# Patient Record
Sex: Male | Born: 1985 | Race: Black or African American | Marital: Single | Smoking: Former smoker
Health system: Southern US, Community
[De-identification: ages and names within clinical notes are randomized; demographics above are authoritative.]

## PROBLEM LIST (undated history)

## (undated) DIAGNOSIS — B029 Zoster without complications: Secondary | ICD-10-CM

## (undated) DIAGNOSIS — D849 Immunodeficiency, unspecified: Secondary | ICD-10-CM

## (undated) DIAGNOSIS — Z21 Asymptomatic human immunodeficiency virus [HIV] infection status: Secondary | ICD-10-CM

## (undated) DIAGNOSIS — I639 Cerebral infarction, unspecified: Secondary | ICD-10-CM

## (undated) DIAGNOSIS — D571 Sickle-cell disease without crisis: Secondary | ICD-10-CM

## (undated) DIAGNOSIS — B20 Human immunodeficiency virus [HIV] disease: Secondary | ICD-10-CM

## (undated) HISTORY — DX: Zoster without complications: B02.9

## (undated) HISTORY — PX: BRAIN SURGERY: SHX531

## (undated) HISTORY — PX: CHOLECYSTECTOMY: SHX55

## (undated) HISTORY — DX: Cerebral infarction, unspecified: I63.9

---

## 2013-03-08 ENCOUNTER — Inpatient Hospital Stay (HOSPITAL_COMMUNITY)
Admission: EM | Admit: 2013-03-08 | Discharge: 2013-03-14 | DRG: 812 | Disposition: A | Discharge status: Home / Self Care | Payer: Medicaid Other | Attending: Internal Medicine | Admitting: Internal Medicine

## 2013-03-08 ENCOUNTER — Inpatient Hospital Stay (HOSPITAL_COMMUNITY): Payer: Medicaid Other

## 2013-03-08 ENCOUNTER — Encounter (HOSPITAL_COMMUNITY): Payer: Self-pay | Admitting: *Deleted

## 2013-03-08 ENCOUNTER — Emergency Department (HOSPITAL_COMMUNITY): Payer: Medicaid Other

## 2013-03-08 ENCOUNTER — Ambulatory Visit: Payer: Self-pay

## 2013-03-08 DIAGNOSIS — Z8679 Personal history of other diseases of the circulatory system: Secondary | ICD-10-CM | POA: Insufficient documentation

## 2013-03-08 DIAGNOSIS — D72829 Elevated white blood cell count, unspecified: Secondary | ICD-10-CM | POA: Diagnosis present

## 2013-03-08 DIAGNOSIS — Z21 Asymptomatic human immunodeficiency virus [HIV] infection status: Secondary | ICD-10-CM | POA: Diagnosis present

## 2013-03-08 DIAGNOSIS — D649 Anemia, unspecified: Secondary | ICD-10-CM

## 2013-03-08 DIAGNOSIS — E871 Hypo-osmolality and hyponatremia: Secondary | ICD-10-CM | POA: Diagnosis present

## 2013-03-08 DIAGNOSIS — D5700 Hb-SS disease with crisis, unspecified: Principal | ICD-10-CM | POA: Diagnosis present

## 2013-03-08 DIAGNOSIS — B20 Human immunodeficiency virus [HIV] disease: Secondary | ICD-10-CM

## 2013-03-08 DIAGNOSIS — G9389 Other specified disorders of brain: Secondary | ICD-10-CM | POA: Diagnosis present

## 2013-03-08 DIAGNOSIS — F17200 Nicotine dependence, unspecified, uncomplicated: Secondary | ICD-10-CM | POA: Diagnosis present

## 2013-03-08 HISTORY — DX: Asymptomatic human immunodeficiency virus (hiv) infection status: Z21

## 2013-03-08 HISTORY — DX: Human immunodeficiency virus (HIV) disease: B20

## 2013-03-08 HISTORY — DX: Sickle-cell disease without crisis: D57.1

## 2013-03-08 HISTORY — DX: Immunodeficiency, unspecified: D84.9

## 2013-03-08 LAB — CBC WITH DIFFERENTIAL/PLATELET
Basophils Absolute: 0.1 10*3/uL (ref 0.0–0.1)
Eosinophils Absolute: 0.2 10*3/uL (ref 0.0–0.7)
Hemoglobin: 7.3 g/dL — ABNORMAL LOW (ref 13.0–17.0)
Lymphocytes Relative: 43 % (ref 12–46)
MCH: 30.9 pg (ref 26.0–34.0)
MCHC: 35.4 g/dL (ref 30.0–36.0)
Monocytes Absolute: 0.9 10*3/uL (ref 0.1–1.0)
Neutrophils Relative %: 44 % (ref 43–77)
Platelets: 380 10*3/uL (ref 150–400)
RDW: 23.4 % — ABNORMAL HIGH (ref 11.5–15.5)

## 2013-03-08 LAB — COMPREHENSIVE METABOLIC PANEL
ALT: 34 U/L (ref 0–53)
Albumin: 3.8 g/dL (ref 3.5–5.2)
Alkaline Phosphatase: 77 U/L (ref 39–117)
Calcium: 8.7 mg/dL (ref 8.4–10.5)
Potassium: 3.7 mEq/L (ref 3.5–5.1)
Sodium: 136 mEq/L (ref 135–145)
Total Protein: 10 g/dL — ABNORMAL HIGH (ref 6.0–8.3)

## 2013-03-08 LAB — RETICULOCYTES: Retic Count, Absolute: 630.1 10*3/uL — ABNORMAL HIGH (ref 19.0–186.0)

## 2013-03-08 MED ORDER — PROMETHAZINE HCL 25 MG/ML IJ SOLN
12.5000 mg | Freq: Once | INTRAMUSCULAR | Status: AC
  Administered 2013-03-08: 12.5 mg via INTRAVENOUS
  Filled 2013-03-08: qty 1

## 2013-03-08 MED ORDER — HYDROMORPHONE HCL PF 2 MG/ML IJ SOLN
2.0000 mg | INTRAMUSCULAR | Status: AC | PRN
  Administered 2013-03-08 (×3): 2 mg via INTRAVENOUS
  Filled 2013-03-08 (×3): qty 1

## 2013-03-08 MED ORDER — HYDROMORPHONE HCL PF 2 MG/ML IJ SOLN: 2.0000 mg | Freq: Once | INTRAMUSCULAR | Status: DC

## 2013-03-08 MED ORDER — ONDANSETRON HCL 4 MG/2ML IJ SOLN
4.0000 mg | Freq: Once | INTRAMUSCULAR | Status: AC
  Administered 2013-03-08: 4 mg via INTRAVENOUS
  Filled 2013-03-08: qty 2

## 2013-03-08 NOTE — ED Provider Notes (Signed)
History    CSN: [id number] Arrival date & time 03/08/13  1456  First MD Initiated Contact with Patient 03/08/13 1611     Chief Complaint  Patient presents with  . Sickle Cell Pain Crisis   (Consider location/radiation/quality/duration/timing/severity/associated sxs/prior Treatment) HPI Comments: Patient with h/o sickle cell disease, HIV -- presents with acute pain flare starting today although he started 'feeling bad' yesterday. Pain is in middle back bilaterally with radiation into lower back and both legs to his knees. Associated with shortness of breath. Patient states he receives frequent transfusions -- last 2 weeks ago at [facility]. Recently moved to [place] and does not yet have doctor here. H/o PNA. Denies fever or cough currently. No N/V/D. Uses hydrocodone at home on prn basis but it did not help today. The onset of this condition was acute. The course is constant. Aggravating factors: none. Alleviating factors: none.    Patient is a 27 y.o. male presenting with sickle cell pain. The history is provided by the patient.  Sickle Cell Pain Crisis Associated symptoms: shortness of breath   Associated symptoms: no chest pain, no cough, no fever, no headaches, no nausea, no sore throat and no vomiting    Past Medical History  Diagnosis Date  . Sickle cell anemia   . Immune deficiency disorder   . HIV (human immunodeficiency virus infection)    Past Surgical History  Procedure Laterality Date  . Brain surgery    . Cholecystectomy     History reviewed. No pertinent family history. History  Substance Use Topics  . Smoking status: Current Every Day Smoker    Types: Cigarettes  . Smokeless tobacco: Not on file  . Alcohol Use: Yes    Review of Systems  Constitutional: Negative for fever and chills.  HENT: Negative for sore throat and rhinorrhea.   Eyes: Negative for redness.  Respiratory: Positive for shortness of breath. Negative for cough.   Cardiovascular: Negative  for chest pain.  Gastrointestinal: Negative for nausea, vomiting, abdominal pain and diarrhea.  Genitourinary: Negative for dysuria.  Musculoskeletal: Positive for myalgias, back pain and arthralgias.  Skin: Positive for rash (resolving shingles right middle back).  Neurological: Negative for headaches.    Allergies  Banana; Other; and Morphine and related  Home Medications   Current Outpatient Rx  Name  Route  Sig  Dispense  Refill  . aspirin EC 81 MG tablet   Oral   Take 81 mg by mouth daily.         Marland Kitchen FOLIC ACID PO   Oral   Take 25 mcg by mouth every evening.         . promethazine (PHENERGAN) 25 MG tablet   Oral   Take 25 mg by mouth every 6 (six) hours as needed for nausea.          BP 113/62  Pulse 88  Temp(Src) 98.3 F (36.8 C) (Oral)  Resp 16  SpO2 92%  Physical Exam  Nursing note and vitals reviewed. Constitutional: He appears well-developed and well-nourished.  HENT:  Head: Normocephalic and atraumatic.  Eyes: Conjunctivae and EOM are normal. Pupils are equal, round, and reactive to light. Right eye exhibits no discharge. Left eye exhibits no discharge.  Icteric.   Neck: Normal range of motion. Neck supple.  Cardiovascular: Normal rate, regular rhythm and normal heart sounds.   Pulmonary/Chest: Effort normal and breath sounds normal.  Abdominal: Soft. There is no tenderness.  Musculoskeletal: He exhibits no edema and no tenderness.  Neurological: He is alert.  Skin: Skin is warm and dry. Rash noted. There is erythema.  R middle back, resolving vesicles  Psychiatric: He has a normal mood and affect.    ED Course  Procedures (including critical care time) Labs Reviewed  CBC WITH DIFFERENTIAL - Abnormal; Notable for the following:    RBC 2.36 (*)    Hemoglobin 7.3 (*)    HCT 20.6 (*)    RDW 23.4 (*)    All other components within normal limits  COMPREHENSIVE METABOLIC PANEL - Abnormal; Notable for the following:    Total Protein 10.0 (*)     AST 95 (*)    Total Bilirubin 9.1 (*)    All other components within normal limits  RETICULOCYTES - Abnormal; Notable for the following:    Retic Ct Pct 26.7 (*)    RBC. 2.36 (*)    Retic Count, Manual 630.1 (*)    All other components within normal limits  URINALYSIS, ROUTINE W REFLEX MICROSCOPIC   Dg Chest 2 View  03/08/2013   *RADIOLOGY REPORT*  Clinical Data: Back pain.  Sickle cell crisis.  CHEST - 2 VIEW  Comparison: None.  Findings: The heart size and pulmonary vascularity are normal and the lungs are clear.  No osseous abnormality.  IMPRESSION: Normal chest.   Original Report Authenticated By: Francene Boyers, M.D.   1. Sickle cell anemia with crisis     4:58 PM Patient seen and examined. Work-up initiated. Medications ordered.   Vital signs reviewed and are as follows: Filed Vitals:   03/08/13 1527  BP: 113/62  Pulse: 88  Temp: 98.3 F (36.8 C)  Resp: 16   7:42 PM [facility] records show recent hematology and ID appointments over past month. Previous Tbili into low teens at times, most recently 6.6 2 weeks ago. Previous CD4 in 600's. Baseline hgb 7.0-8.5.   No improvement at all with 2 doses of pain medication. Will need admission.   8:12 PM Spoke with [facility] who will see.    MDM  Sickle cell anemia with vaso-occlusive crisis.   Renne Crigler, PA-C 03/08/13 2013

## 2013-03-08 NOTE — ED Provider Notes (Signed)
Medical screening examination/treatment/procedure(s) were conducted as a shared visit with non-physician practitioner(s) and myself.  I personally evaluated the patient during the encounter  Typical sickle cell pain in back and legs. No fever.  No chest pain or SOB. 5/5 strength in bilateral lower extremities. Ankle plantar and dorsiflexion intact. Great toe extension intact bilaterally. +2 DP and PT pulses. +2 patellar reflexes bilaterally. Normal gait.   Glynn Octave, MD 03/08/13 2140

## 2013-03-08 NOTE — H&P (Signed)
[facility] History and Physical  Raheel Kunkle ZOX:[id number] DOB: 1985/10/20 DOA: 03/08/2013  Referring physician: ER physician. PCP: No PCP Per Patient  Specialists: Follows at [facility] for sickle cell disease and HIV.  Chief Complaint: Pain.  HPI: Jeremy Mclaughlin is a 27 y.o. male with known history of SS hemoglobin sickle cell disease, HIV and history of moyamoya syndrome status post craniectomy, presented to the ER because of low back pain and lower extremity pain. Patient has been having these symptoms since today morning. Had mild shortness of breath but chest x-ray was unremarkable and patient was not hypoxic. Patient denies any fever chills chest pain diarrhea. Patient also has been having some headache since morning with some nausea but denies any weakness of the upper or lower extremities. At this time patient has been admitted for pain relief secondary to sickle cell crisis. Patient states that he has had recent blood works done at [facility] 2 weeks ago and his CD4 count was around 650 and he is yet to be started on antiretroviral until his medical insurance has been confirmed. Patient will be transferred to [facility] for further management and patient is in agreement. Patient did have one episode of nausea and vomiting in the ER.  Review of Systems: As presented in the history of presenting illness, rest negative.  Past Medical History  Diagnosis Date  . Sickle cell anemia   . Immune deficiency disorder   . HIV (human immunodeficiency virus infection)    Past Surgical History  Procedure Laterality Date  . Brain surgery    . Cholecystectomy     Social History:  reports that he has been smoking Cigarettes.  He has been smoking about 0.00 packs per day. He does not have any smokeless tobacco history on file. He reports that  drinks alcohol. He reports that he does not use illicit drugs. Home. where does patient live-- Can do ADLs. Can patient  participate in ADLs?  Allergies  Allergen Reactions  . Banana Anaphylaxis  . Other Anaphylaxis and Rash    melons  . Morphine And Related     Doesn't work for pt    History reviewed. No pertinent family history.    Prior to Admission medications   Medication Sig Start Date End Date Taking? Authorizing Provider  aspirin EC 81 MG tablet Take 81 mg by mouth daily.   Yes Historical Provider, MD  FOLIC ACID PO Take 25 mcg by mouth every evening.   Yes Historical Provider, MD  promethazine (PHENERGAN) 25 MG tablet Take 25 mg by mouth every 6 (six) hours as needed for nausea.   Yes Historical Provider, MD   Physical Exam: Filed Vitals:   03/08/13 1527 03/08/13 1630 03/08/13 1700 03/08/13 2020  BP: 113/62 116/54 117/61 117/66  Pulse: 88 70 56 62  Temp: 98.3 F (36.8 C)     TempSrc: Oral     Resp: 16 14 16    SpO2: 92% 100% 100% 96%     General:  Well-developed and nourished.  Eyes: Anicteric no pallor.  ENT: No discharge from the ears eyes nose mouth.  Neck: No mass felt.  Cardiovascular: S1-S2 heard.  Respiratory: No rhonchi or crepitations.  Abdomen: Soft nontender bowel sounds present.  Skin: No rash.  Musculoskeletal: No edema.  Psychiatric: Appears normal.  Neurologic: Alert awake oriented to time place and person. Moves all extremities 5 x 5.  Labs on Admission:  Basic Metabolic Panel:  Recent Labs Lab 03/08/13 1707  NA 136  K 3.7  CL 102  CO2 28  GLUCOSE 87  BUN 6  CREATININE 0.70  CALCIUM 8.7   Liver Function Tests:  Recent Labs Lab 03/08/13 1707  AST 95*  ALT 34  ALKPHOS 77  BILITOT 9.1*  PROT 10.0*  ALBUMIN 3.8   No results found for this basename: LIPASE, AMYLASE,  in the last 168 hours No results found for this basename: AMMONIA,  in the last 168 hours CBC:  Recent Labs Lab 03/08/13 1707  WBC 9.3  NEUTROABS 4.1  HGB 7.3*  HCT 20.6*  MCV 87.3  PLT 380   Cardiac Enzymes: No results found for this basename: CKTOTAL, CKMB,  CKMBINDEX, TROPONINI,  in the last 168 hours  BNP (last 3 results) No results found for this basename: PROBNP,  in the last 8760 hours CBG: No results found for this basename: GLUCAP,  in the last 168 hours  Radiological Exams on Admission: Dg Chest 2 View  03/08/2013   *RADIOLOGY REPORT*  Clinical Data: Back pain.  Sickle cell crisis.  CHEST - 2 VIEW  Comparison: None.  Findings: The heart size and pulmonary vascularity are normal and the lungs are clear.  No osseous abnormality.  IMPRESSION: Normal chest.   Original Report Authenticated By: Francene Boyers, M.D.     Assessment/Plan Principal Problem:   Sickle cell pain crisis Active Problems:   History of Moyamoya syndrome   HIV (human immunodeficiency virus infection)   Anemia   1. Sickle cell pain crisis - patient has been placed on scheduled dose of Dilaudid. Continue home medications. Gentle hydration. 2. HIV - as per patient patient's last CD4 count 2 weeks ago was 650 and patient is to follow with [facility] clinic with regards to his HIV medications. 3. Anemia - secondary sickle cell disease. 4. History of moyamoya syndrome - I did discuss with on-call neurologist about patient's headache. At this time Dr. Amada Jupiter on-call urologist as advised to get a CT head. Otherwise patient is nonfocal.  Patient will be transferred to [facility]. I did discuss with Dr. Houston Siren who will be the accepting physician at [facility]. Patient is in agreement with transfer.  Code Status: Full code.  Family Communication: None.  Disposition Plan: Admit to inpatient.    KAKRAKANDY,ARSHAD N. [facility] Pager [phone].  If 7PM-7AM, please contact night-coverage [email or web address] Password [facility] 03/08/2013, 9:55 PM

## 2013-03-08 NOTE — ED Notes (Addendum)
To ED for eval of sickle cell pain that started a few hrs pta. Pain in mid back that shoots down to legs. Last crisis was 2 months ago. Pt is usually seen at [facility]. Pt has blood transfusions every 6 wks and had one 2 wks ago

## 2013-03-08 NOTE — ED Notes (Signed)
Pt placed on 2L [place] O2 while in waiting room

## 2013-03-09 ENCOUNTER — Encounter (HOSPITAL_COMMUNITY): Payer: Self-pay | Admitting: *Deleted

## 2013-03-09 DIAGNOSIS — D72829 Elevated white blood cell count, unspecified: Secondary | ICD-10-CM | POA: Diagnosis present

## 2013-03-09 DIAGNOSIS — E871 Hypo-osmolality and hyponatremia: Secondary | ICD-10-CM | POA: Diagnosis present

## 2013-03-09 LAB — BASIC METABOLIC PANEL
CO2: 27 mEq/L (ref 19–32)
Calcium: 8.8 mg/dL (ref 8.4–10.5)
Chloride: 104 mEq/L (ref 96–112)
Creatinine, Ser: 0.6 mg/dL (ref 0.50–1.35)
GFR calc Af Amer: >90 mL/min (ref >90)
Sodium: 134 mEq/L — ABNORMAL LOW (ref 135–145)

## 2013-03-09 LAB — CBC
MCV: 84.8 fL (ref 78.0–100.0)
Platelets: 315 10*3/uL (ref 150–400)
RBC: 2.11 MIL/uL — ABNORMAL LOW (ref 4.22–5.81)
RDW: 21.6 % — ABNORMAL HIGH (ref 11.5–15.5)
WBC: 11.8 10*3/uL — ABNORMAL HIGH (ref 4.0–10.5)

## 2013-03-09 LAB — URINALYSIS, ROUTINE W REFLEX MICROSCOPIC
Glucose, UA: NEGATIVE mg/dL
Ketones, ur: NEGATIVE mg/dL
Leukocytes, UA: NEGATIVE
Nitrite: NEGATIVE
Protein, ur: NEGATIVE mg/dL
pH: 5.5 (ref 5.0–8.0)

## 2013-03-09 LAB — PREPARE RBC (CROSSMATCH)

## 2013-03-09 LAB — URINE MICROSCOPIC-ADD ON

## 2013-03-09 MED ORDER — ONDANSETRON HCL 4 MG PO TABS
4.0000 mg | ORAL_TABLET | Freq: Four times a day (QID) | ORAL | Status: DC | PRN
  Administered 2013-03-10 (×2): 4 mg via ORAL
  Filled 2013-03-09 (×2): qty 1

## 2013-03-09 MED ORDER — ACETAMINOPHEN 325 MG PO TABS
650.0000 mg | ORAL_TABLET | Freq: Four times a day (QID) | ORAL | Status: DC | PRN
  Administered 2013-03-09: 650 mg via ORAL
  Filled 2013-03-09: qty 2

## 2013-03-09 MED ORDER — PROMETHAZINE HCL 25 MG/ML IJ SOLN
12.5000 mg | Freq: Four times a day (QID) | INTRAMUSCULAR | Status: DC | PRN
  Administered 2013-03-09 (×2): 12.5 mg via INTRAVENOUS
  Filled 2013-03-09 (×2): qty 1

## 2013-03-09 MED ORDER — SODIUM CHLORIDE 0.45 % IV SOLN
INTRAVENOUS | Status: DC
  Administered 2013-03-09: 100 mL/h via INTRAVENOUS
  Administered 2013-03-09: 19:00:00 via INTRAVENOUS

## 2013-03-09 MED ORDER — HYDROMORPHONE HCL PF 2 MG/ML IJ SOLN
2.0000 mg | INTRAMUSCULAR | Status: DC
  Administered 2013-03-09 – 2013-03-10 (×11): 2 mg via INTRAVENOUS
  Filled 2013-03-09 (×11): qty 1

## 2013-03-09 MED ORDER — ACETAMINOPHEN 650 MG RE SUPP: 650.0000 mg | Freq: Four times a day (QID) | RECTAL | Status: DC | PRN

## 2013-03-09 MED ORDER — ONDANSETRON HCL 4 MG/2ML IJ SOLN
4.0000 mg | Freq: Four times a day (QID) | INTRAMUSCULAR | Status: DC | PRN
  Administered 2013-03-09 – 2013-03-10 (×4): 4 mg via INTRAVENOUS
  Filled 2013-03-09 (×4): qty 2

## 2013-03-09 MED ORDER — VITAMINS A & D EX OINT
TOPICAL_OINTMENT | CUTANEOUS | Status: AC
  Administered 2013-03-09: 17:00:00
  Filled 2013-03-09: qty 5

## 2013-03-09 MED ORDER — ASPIRIN EC 81 MG PO TBEC
81.0000 mg | DELAYED_RELEASE_TABLET | Freq: Every day | ORAL | Status: DC
  Administered 2013-03-09 – 2013-03-14 (×6): 81 mg via ORAL
  Filled 2013-03-09 (×6): qty 1

## 2013-03-09 NOTE — Care Management (Signed)
Patient: Jeremy Mclaughlin DOB :December 14, 1985 MRN :[id number]  Date: 03/09/2013  Documentation Initiated by : Jefm Miles  Subjective/Objective Assessment: Mr. Jeremy Mclaughlin is a 27 year old male currently inpatient. Mr. Jeremy Mclaughlin stated "he does not have a PCP." Mr. Jeremy Mclaughlin stated "he is new to this area", he previously resided in [place].  This CM explained the day hospital availablity with [facility] and he would need to have Dr. [name] as his PCP to take advantage of the [facility] day hospital. Mr. Jeremy Mclaughlin verbalized understanding.   Barriers to Care: Mr. Jeremy Mclaughlin needs a PCP  Prior Approval (PA) #: NA PA start date:NA PA end date:  NA  Action/Plan: This CM provided Mr. Jeremy Mclaughlin with a potential new patient packet to complete.   Comments: Mr. Jeremy Mclaughlin did not have any other questions or concerns at this time.  Time spent: 15 minutes  Karoline Caldwell, RN, BSN, [place]    [phone]

## 2013-03-09 NOTE — Progress Notes (Signed)
[facility] PROGRESS NOTE  Jeremy Mclaughlin AVW:[id number] DOB: 09/06/1986 DOA: 03/08/2013 PCP: No PCP Per Patient  Brief narrative: 27 -year-old male with past medical history of sickle cell disease, SS hemoglobin disease, HIV who presented to [facility] ED 03/08/2013 with worsening low back pain and lower extremity pain. Patient has had the symptoms over past 24 hours prior to this admission. He has experienced associated shortness of breath but no chest pain, no fevers or chills. No abdominal pain. In ED, blood pressure is 113/62, HR 50, T 97.8 F and O2 saturation 89% on room air. CBC revealed hemoglobin of 7.3. CT head was negative for acute intracranial findings but he does have postoperative changes from previous brain surgery (for Moyamoya syndrome). Chest x-ray was within normal limits.  Assessment/Plan:  Principal Problem:   Sickle cell pain crisis - Current pain regimen is with a Dilaudid 2 mg IV every 3 hours scheduled  - Patient reports being in pain even with this regimen but feels comfortable with the current pain level, 6/10 - Advance diet as tolerated Active Problems:   HIV (human immunodeficiency virus infection) - Check CD4 count   Anemia of chronic disease - Secondary to history of sickle cell disease - We will give 1 unit of blood today as hemoglobin is 6.5 today.   Leukocytosis - No fever or signs of acute infection - Likely reactive   Hyponatremia - Likely secondary to dehydration - Continue IV fluids  Code Status: full code Family Communication: no family at the bedside Disposition Plan: home when stable   Manson Passey, MD  [facility] Pager [phone]  If 7PM-7AM, please contact night-coverage [email or web address] Password [facility] 03/09/2013, 9:53 AM   LOS: 1 day   Consultants:  None  Procedures:  None  Antibiotics:  None  HPI/Subjective: No acute overnight events.  Objective: Filed Vitals:   03/08/13 2020 03/08/13 2309 03/09/13 0035 03/09/13 0500  BP: 117/66  127/68 124/64 125/57  Pulse: 62 63 50 72  Temp:   98.1 F (36.7 C) 97.8 F (36.6 C)  TempSrc:   Oral Oral  Resp:   16 14  Height:   5\' 5"  (1.651 m)   Weight:   61.1 kg (134 lb 11.2 oz) 60.782 kg (134 lb)  SpO2: 96% 93% 99% 89%    Intake/Output Summary (Last 24 hours) at 03/09/13 0953 Last data filed at 03/09/13 0248  Gross per 24 hour  Intake      0 ml  Output    500 ml  Net   -500 ml    Exam:   General:  Pt is alert, follows commands appropriately, not in acute distress  Cardiovascular: Regular rate and rhythm, S1/S2, no murmurs, no rubs, no gallops  Respiratory: Clear to auscultation bilaterally, no wheezing, no crackles, no rhonchi  Abdomen: Soft, non tender, non distended, bowel sounds present, no guarding  Extremities: No edema, pulses DP and PT palpable bilaterally  Neuro: Grossly nonfocal  Data Reviewed: Basic Metabolic Panel:  Recent Labs Lab 03/08/13 1707 03/09/13 0343  NA 136 134*  K 3.7 3.5  CL 102 104  CO2 28 27  GLUCOSE 87 95  BUN 6 5*  CREATININE 0.70 0.60  CALCIUM 8.7 8.8   Liver Function Tests:  Recent Labs Lab 03/08/13 1707  AST 95*  ALT 34  ALKPHOS 77  BILITOT 9.1*  PROT 10.0*  ALBUMIN 3.8   No results found for this basename: LIPASE, AMYLASE,  in the last 168 hours No results found for this  basename: AMMONIA,  in the last 168 hours CBC:  Recent Labs Lab 03/08/13 1707 03/09/13 0343  WBC 9.3 11.8*  NEUTROABS 4.1  --   HGB 7.3* 6.5*  HCT 20.6* 17.9*  MCV 87.3 84.8  PLT 380 315   Cardiac Enzymes: No results found for this basename: CKTOTAL, CKMB, CKMBINDEX, TROPONINI,  in the last 168 hours BNP: No components found with this basename: POCBNP,  CBG: No results found for this basename: GLUCAP,  in the last 168 hours  No results found for this or any previous visit (from the past 240 hour(s)).   Studies: Dg Chest 2 View 03/08/2013     IMPRESSION: Normal chest.   Original Report Authenticated By: Francene Boyers, M.D.    Ct Head Wo Contrast 03/08/2013    IMPRESSION: Postoperative changes with right frontal encephalomalacia. No acute intracranial abnormality.   Original Report Authenticated By: Charlett Nose, M.D.    Scheduled Meds: . aspirin EC  81 mg Oral Daily  .  HYDROmorphone (DILAUDID) injection  2 mg Intravenous Q3H   Continuous Infusions: . sodium chloride 100 mL/hr (03/09/13 0302)

## 2013-03-09 NOTE — Progress Notes (Signed)
CALLED  HGB OF  6.5 TO HOUSE COVERAGE MD, NO NEW ORDERS, Geni Skorupski RN

## 2013-03-09 NOTE — Care Management Note (Addendum)
CARE MANAGEMENT NOTE 03/09/2013  Patient:  Jeremy Mclaughlin, Jeremy Mclaughlin   Account Number:  [id number]  Date Initiated:  03/09/2013  Documentation initiated by:  Donette Mainwaring  Subjective/Objective Assessment:   27 yo male admitted with sickle cell crisis.     Action/Plan:   Home when stable   Anticipated DC Date:     Anticipated DC Plan:  HOME/SELF CARE  In-house referral  [facility]      DC Planning Services  CM consult  PCP issues      Choice offered to / List presented to:  NA   DME arranged  NA      DME agency  NA        Status of service:  In process, will continue to follow Medicare Important Message given?   (If response is "NO", the following Medicare IM given date fields will be blank) Date Medicare IM given:   Date Additional Medicare IM given:    Discharge Disposition:    Per UR Regulation:  Reviewed for med. necessity/level of care/duration of stay  If discussed at Long Length of Stay Meetings, dates discussed:    Comments:  03/09/13 1208 Leonie Green [phone] Cm spoke with patient at bedside concerning identified barriers of self-pay admission. Per pt recently applied for medicaid and insurance through [employer] and [employer]. Cm contacted [facility] at [facility] to establish possible Primary Care for pt at [facility] with Dr.Matthews. Will continue to follow for possible medication assistance.

## 2013-03-10 LAB — TYPE AND SCREEN
ABO/RH(D): A POS
Antibody Screen: NEGATIVE
Unit division: 0

## 2013-03-10 LAB — BASIC METABOLIC PANEL
BUN: 4 mg/dL — ABNORMAL LOW (ref 6–23)
Calcium: 8.6 mg/dL (ref 8.4–10.5)
Chloride: 97 mEq/L (ref 96–112)
Creatinine, Ser: 0.51 mg/dL (ref 0.50–1.35)
GFR calc Af Amer: >90 mL/min (ref >90)

## 2013-03-10 LAB — T-HELPER CELLS (CD4) COUNT (NOT AT ARMC)
CD4 % Helper T Cell: 14 % — ABNORMAL LOW (ref 33–55)
CD4 T Cell Abs: 600 uL (ref 400–2700)

## 2013-03-10 LAB — CBC
HCT: 19.1 % — ABNORMAL LOW (ref 39.0–52.0)
MCHC: 36.6 g/dL — ABNORMAL HIGH (ref 30.0–36.0)
MCV: 80.9 fL (ref 78.0–100.0)
RDW: 20 % — ABNORMAL HIGH (ref 11.5–15.5)

## 2013-03-10 MED ORDER — SODIUM CHLORIDE 0.45 % IV SOLN: INTRAVENOUS | Status: DC

## 2013-03-10 MED ORDER — AZITHROMYCIN 600 MG PO TABS
1200.0000 mg | ORAL_TABLET | ORAL | Status: DC
  Filled 2013-03-10: qty 2

## 2013-03-10 MED ORDER — ONDANSETRON HCL 4 MG PO TABS
4.0000 mg | ORAL_TABLET | ORAL | Status: DC | PRN
  Administered 2013-03-10 – 2013-03-14 (×7): 4 mg via ORAL
  Filled 2013-03-10 (×7): qty 1

## 2013-03-10 MED ORDER — SODIUM CHLORIDE 0.9 % IV SOLN
INTRAVENOUS | Status: DC
  Administered 2013-03-10 – 2013-03-11 (×3): via INTRAVENOUS

## 2013-03-10 MED ORDER — SULFAMETHOXAZOLE-TMP DS 800-160 MG PO TABS
1.0000 | ORAL_TABLET | ORAL | Status: DC
  Filled 2013-03-10: qty 1

## 2013-03-10 MED ORDER — HYDROMORPHONE HCL PF 2 MG/ML IJ SOLN
2.0000 mg | INTRAMUSCULAR | Status: DC | PRN
  Administered 2013-03-10 – 2013-03-12 (×7): 2 mg via INTRAVENOUS
  Filled 2013-03-10 (×8): qty 1

## 2013-03-10 MED ORDER — HYDROMORPHONE HCL 2 MG PO TABS
2.0000 mg | ORAL_TABLET | ORAL | Status: DC
  Administered 2013-03-10 – 2013-03-12 (×10): 2 mg via ORAL
  Filled 2013-03-10 (×13): qty 1

## 2013-03-10 MED ORDER — ONDANSETRON HCL 4 MG/2ML IJ SOLN
4.0000 mg | INTRAMUSCULAR | Status: DC | PRN
  Administered 2013-03-11 (×2): 4 mg via INTRAVENOUS
  Filled 2013-03-10 (×2): qty 2

## 2013-03-10 NOTE — Progress Notes (Signed)
Pharmacy Note - Bactrim for PCP Prophylaxis  A/P: To start Bactrim for PCP ppx in HIV patient. Labs reviewed. Start Bactrim DS 1 tab MWF. Will sign off   Hessie Knows, PharmD, BCPS Pager [phone] 03/10/2013 1:55 PM

## 2013-03-10 NOTE — Progress Notes (Addendum)
[facility] PROGRESS NOTE  Jeremy Mclaughlin ZOX:[id number] DOB: Jan 28, 1986 DOA: 03/08/2013 PCP: No PCP Per Patient  Brief narrative: 27 -year-old male with past medical history of sickle cell disease, SS hemoglobin disease, HIV who presented to [facility] ED 03/08/2013 with worsening low back pain and lower extremity pain. Patient has had the symptoms over past 24 hours prior to this admission. He has experienced associated shortness of breath but no chest pain, no fevers or chills. No abdominal pain.  In ED, blood pressure is 113/62, HR 50, T 97.8 F and O2 saturation 89% on room air. CBC revealed hemoglobin of 7.3. CT head was negative for acute intracranial findings but he does have postoperative changes from previous brain surgery (for Moyamoya syndrome). Chest x-ray was within normal limits.   Assessment/Plan:   Principal Problem:  Sickle cell pain crisis  - Current pain regimen is with a Dilaudid 2 mg IV every 3 hours scheduled and we will change this to every 3 hours as needed. Added Dilaudid 2 mg every 3 hours as needed by mouth for moderate pain. - Advance diet as tolerated  Active Problems:  HIV (human immunodeficiency virus infection)  - CD4 count 600 Anemia of chronic disease  - Secondary to history of sickle cell disease  - Status post 1 unit of blood 03/09/2013 for hemoglobin of 6.5. Hemoglobin is 7 today. - Follow up CBC in the morning  Leukocytosis  - We will start antibiotics as above. Hyponatremia  - Likely secondary to dehydration  - D/C IV fluids; diet as tolerated   Code Status: full code  Family Communication: no family at the bedside  Disposition Plan: home when stable   Manson Passey, MD  [facility]  Pager [phone]   Consultants:  ID Procedures:  None Antibiotics:  None   If 7PM-7AM, please contact night-coverage [email or web address] Password [facility] 03/10/2013, 12:14 PM   LOS: 2 days    HPI/Subjective: No acute overnight events.  Objective: Filed Vitals:    03/10/13 0014 03/10/13 0226 03/10/13 0444 03/10/13 1010  BP: 132/72 128/66 132/80 132/82  Pulse: 88 85 99 81  Temp: 98.9 F (37.2 C) 98.7 F (37.1 C) 98.5 F (36.9 C) 98.3 F (36.8 C)  TempSrc: Oral Oral Oral   Resp: 18 18 18 16   Height:      Weight:      SpO2:  93% 92% 95%    Intake/Output Summary (Last 24 hours) at 03/10/13 1214 Last data filed at 03/10/13 1100  Gross per 24 hour  Intake  732.5 ml  Output   2150 ml  Net -1417.5 ml    Exam:   General:  Pt is alert, follows commands appropriately, not in acute distress  Cardiovascular: Regular rate and rhythm, S1/S2, no murmurs, no rubs, no gallops  Respiratory: Clear to auscultation bilaterally, no wheezing, no crackles, no rhonchi  Abdomen: Soft, non tender, non distended, bowel sounds present, no guarding  Extremities: No edema, pulses DP and PT palpable bilaterally  Neuro: Grossly nonfocal  Data Reviewed: Basic Metabolic Panel:  Recent Labs Lab 03/08/13 1707 03/09/13 0343 03/10/13 0250  NA 136 134* 132*  K 3.7 3.5 3.6  CL 102 104 97  CO2 28 27 27   GLUCOSE 87 95 90  BUN 6 5* 4*  CREATININE 0.70 0.60 0.51  CALCIUM 8.7 8.8 8.6   Liver Function Tests:  Recent Labs Lab 03/08/13 1707  AST 95*  ALT 34  ALKPHOS 77  BILITOT 9.1*  PROT 10.0*  ALBUMIN 3.8  No results found for this basename: LIPASE, AMYLASE,  in the last 168 hours No results found for this basename: AMMONIA,  in the last 168 hours CBC:  Recent Labs Lab 03/08/13 1707 03/09/13 0343 03/10/13 0250  WBC 9.3 11.8* 12.0*  NEUTROABS 4.1  --   --   HGB 7.3* 6.5* 7.0*  HCT 20.6* 17.9* 19.1*  MCV 87.3 84.8 80.9  PLT 380 315 280   Cardiac Enzymes: No results found for this basename: CKTOTAL, CKMB, CKMBINDEX, TROPONINI,  in the last 168 hours BNP: No components found with this basename: POCBNP,  CBG: No results found for this basename: GLUCAP,  in the last 168 hours  No results found for this or any previous visit (from the past  240 hour(s)).   Studies: Dg Chest 2 View  03/08/2013   *RADIOLOGY REPORT*  Clinical Data: Back pain.  Sickle cell crisis.  CHEST - 2 VIEW  Comparison: None.  Findings: The heart size and pulmonary vascularity are normal and the lungs are clear.  No osseous abnormality.  IMPRESSION: Normal chest.   Original Report Authenticated By: Francene Boyers, M.D.   Ct Head Wo Contrast  03/08/2013   *RADIOLOGY REPORT*  Clinical Data: Sickle cell pain.  Headache.  CT HEAD WITHOUT CONTRAST  Technique:  Contiguous axial images were obtained from the base of the skull through the vertex without contrast.  Comparison: None.  Findings: Postoperative changes from prior right craniotomy. Underlying right frontal encephalomalacia.  No acute intracranial abnormality.  Specifically, no hemorrhage, hydrocephalus, mass lesion, acute infarction, or significant intracranial injury.  No acute calvarial abnormality. Visualized paranasal sinuses and mastoids clear.  Orbital soft tissues unremarkable.  IMPRESSION: Postoperative changes with right frontal encephalomalacia. No acute intracranial abnormality.   Original Report Authenticated By: Charlett Nose, M.D.    Scheduled Meds: . aspirin EC  81 mg Oral Daily  . HYDROmorphone  2 mg Oral Q3H   Continuous Infusions: . sodium chloride 75 mL/hr at 03/10/13 [phone]

## 2013-03-10 NOTE — Consult Note (Signed)
    [facility]     Reason for Consult: 042    Referring Physician: dr. Elisabeth Pigeon  Principal Problem:   Sickle cell pain crisis Active Problems:   HIV (human immunodeficiency virus infection)   Anemia   Leukocytosis, unspecified   Hyponatremia   . aspirin EC  81 mg Oral Daily  . azithromycin  1,200 mg Oral Weekly  . HYDROmorphone  2 mg Oral Q3H  . sulfamethoxazole-trimethoprim  1 tablet Oral 3 times weekly    Recommendations: Will hold on ARVs until seen in clinic Has contact with our case manager Tad Moore) and is going to get into our clinic for follow up and transfer care to [place] No need for prophylaxis with CD4 of 600  Thanks for the consult  Assessment: HIV and sickle cell with [place] crises.  Last CD 4 of 770 at [facility] this month and viral load about 1500 and CD4 count here is 600 (14%).  Has not previously been on meds but interested in starting.  Appropriate labs done recently at [facility].     Antibiotics: none  HPI: Jeremy Mclaughlin is a 27 y.o. male with HIV and sickle cell, hx of moyamoya syndrome, hx of craniectomy who came in with [place] crisis.  No recent illnesses.  No hx of STIs, OIs.    Review of Systems: A comprehensive review of systems was negative.  Past Medical History  Diagnosis Date  . Sickle cell anemia   . Immune deficiency disorder   . HIV (human immunodeficiency virus infection)     History  Substance Use Topics  . Smoking status: Current Every Day Smoker    Types: Cigarettes  . Smokeless tobacco: Not on file  . Alcohol Use: Yes    History reviewed. No pertinent family history. Allergies  Allergen Reactions  . Banana Anaphylaxis  . Other Anaphylaxis and Rash    melons  . Morphine And Related     Doesn't work for pt    OBJECTIVE: Blood pressure 132/74, pulse 85, temperature 98.2 F (36.8 C), temperature source Oral, resp. rate 18, height 5\' 5"  (1.651 m), weight 134 lb (60.782 kg), SpO2 93.00%. General: AAO x 3,  nad Skin: no rashes Lungs: CTA B Cor: RRR without m/r/g Abdomen: soft, nt, nd Ext: no edema  Microbiology: No results found for this or any previous visit (from the past 240 hour(s)).  Staci Righter, MD [facility] [email or web address] [phone] pager  [phone] cell 03/10/2013, 4:01 PM

## 2013-03-10 NOTE — Progress Notes (Signed)
1 unit of PRBC finished at 0012, VSS, patient tolerated well with no reaction.  Will continue to monitor.

## 2013-03-11 DIAGNOSIS — E871 Hypo-osmolality and hyponatremia: Secondary | ICD-10-CM

## 2013-03-11 LAB — CBC
MCH: 30.3 pg (ref 26.0–34.0)
MCHC: 36.3 g/dL — ABNORMAL HIGH (ref 30.0–36.0)
Platelets: 296 10*3/uL (ref 150–400)
RBC: 2.44 MIL/uL — ABNORMAL LOW (ref 4.22–5.81)

## 2013-03-11 MED ORDER — PROMETHAZINE HCL 25 MG/ML IJ SOLN
12.5000 mg | Freq: Once | INTRAMUSCULAR | Status: AC
  Administered 2013-03-11: 12.5 mg via INTRAVENOUS
  Filled 2013-03-11: qty 1

## 2013-03-11 NOTE — Progress Notes (Signed)
[facility] PROGRESS NOTE  Jeremy Mclaughlin EAV:[id number] DOB: 07/03/1986 DOA: 03/08/2013 PCP: No PCP Per Patient  Brief narrative: 27 -year-old male with past medical history of sickle cell disease, SS hemoglobin disease, HIV who presented to [facility] ED 03/08/2013 with worsening low back pain and lower extremity pain. Patient has had the symptoms over past 24 hours prior to this admission. He has experienced associated shortness of breath but no chest pain, no fevers or chills. No abdominal pain.  In ED, blood pressure is 113/62, HR 50, T 97.8 F and O2 saturation 89% on room air. CBC revealed hemoglobin of 7.3. CT head was negative for acute intracranial findings but he does have postoperative changes from previous brain surgery (for Moyamoya syndrome). Chest x-ray was within normal limits.   Assessment/Plan:   Principal Problem:  Sickle cell pain crisis  - Current pain regimen: Dilaudid 2 mg PO every 3 hours scheduled and then dilaudid 2 mg Q 3 hours IV PRN   - Discontinued IV fluids Active Problems:  HIV (human immunodeficiency virus infection)  - CD4 count 600  Anemia of chronic disease  - Secondary to history of sickle cell disease  - Status post 1 unit of blood 03/09/2013 for hemoglobin of 6.5. Hemoglobin is 7.4 today.  Leukocytosis  - likely reactive Hyponatremia  - Likely secondary to dehydration  - D/Ced IV fluids; diet as tolerated   Code Status: full code  Family Communication: no family at the bedside  Disposition Plan: home when stable   Manson Passey, MD  [place]  Pager [phone]   Consultants:  ID Procedures:  None Antibiotics:  None    If 7PM-7AM, please contact night-coverage [email or web address] Password [facility] 03/11/2013, 12:52 PM   LOS: 3 days    HPI/Subjective: Still with generalized pain.  Objective: Filed Vitals:   03/10/13 2011 03/11/13 0154 03/11/13 0417 03/11/13 1018  BP:  118/68 127/95 121/80  Pulse:  90 88 95  Temp:  99 F (37.2 C) 98.3 F (36.8  C) 99 F (37.2 C)  TempSrc:  Oral Oral Oral  Resp:  20 20 16   Height:      Weight:      SpO2: 94% 94% 92% 96%    Intake/Output Summary (Last 24 hours) at 03/11/13 1252 Last data filed at 03/11/13 1308  Gross per 24 hour  Intake   2357 ml  Output   2150 ml  Net    207 ml    Exam:   General:  Pt is alert, follows commands appropriately, not in acute distress  Cardiovascular: Regular rate and rhythm, S1/S2, no murmurs, no rubs, no gallops  Respiratory: Clear to auscultation bilaterally, no wheezing, no crackles, no rhonchi  Abdomen: Soft, non tender, non distended, bowel sounds present, no guarding  Extremities: No edema, pulses DP and PT palpable bilaterally  Neuro: Grossly nonfocal  Data Reviewed: Basic Metabolic Panel:  Recent Labs Lab 03/08/13 1707 03/09/13 0343 03/10/13 0250  NA 136 134* 132*  K 3.7 3.5 3.6  CL 102 104 97  CO2 28 27 27   GLUCOSE 87 95 90  BUN 6 5* 4*  CREATININE 0.70 0.60 0.51  CALCIUM 8.7 8.8 8.6   Liver Function Tests:  Recent Labs Lab 03/08/13 1707  AST 95*  ALT 34  ALKPHOS 77  BILITOT 9.1*  PROT 10.0*  ALBUMIN 3.8   No results found for this basename: LIPASE, AMYLASE,  in the last 168 hours No results found for this basename: AMMONIA,  in the last  168 hours CBC:  Recent Labs Lab 03/08/13 1707 03/09/13 0343 03/10/13 0250 03/11/13 0412  WBC 9.3 11.8* 12.0* 10.2  NEUTROABS 4.1  --   --   --   HGB 7.3* 6.5* 7.0* 7.4*  HCT 20.6* 17.9* 19.1* 20.4*  MCV 87.3 84.8 80.9 83.6  PLT 380 315 280 296   Cardiac Enzymes: No results found for this basename: CKTOTAL, CKMB, CKMBINDEX, TROPONINI,  in the last 168 hours BNP: No components found with this basename: POCBNP,  CBG: No results found for this basename: GLUCAP,  in the last 168 hours  No results found for this or any previous visit (from the past 240 hour(s)).   Studies: No results found.  Scheduled Meds: . aspirin EC  81 mg Oral Daily  . HYDROmorphone  2 mg Oral  Q3H   Continuous Infusions: . sodium chloride 75 mL/hr at 03/11/13 [phone]

## 2013-03-12 MED ORDER — HYDROMORPHONE HCL PF 2 MG/ML IJ SOLN
2.0000 mg | INTRAMUSCULAR | Status: DC | PRN
  Administered 2013-03-13: 2 mg via INTRAVENOUS
  Filled 2013-03-12: qty 1

## 2013-03-12 MED ORDER — HYDROMORPHONE HCL 2 MG PO TABS
2.0000 mg | ORAL_TABLET | ORAL | Status: DC | PRN
  Administered 2013-03-12 – 2013-03-13 (×4): 2 mg via ORAL
  Filled 2013-03-12 (×5): qty 1

## 2013-03-12 NOTE — Progress Notes (Addendum)
[facility] PROGRESS NOTE  Fuad Forget MVH:[id number] DOB: 12/25/1985 DOA: 03/08/2013 PCP: No PCP Per Patient  Brief narrative: 27 -year-old male with past medical history of sickle cell disease, SS hemoglobin disease, HIV who presented to [facility] ED 03/08/2013 with worsening low back pain and lower extremity pain. Patient has had the symptoms over past 24 hours prior to this admission. He has experienced associated shortness of breath but no chest pain, no fevers or chills. No abdominal pain.  In ED, blood pressure is 113/62, HR 50, T 97.8 F and O2 saturation 89% on room air. CBC revealed hemoglobin of 7.3. CT head was negative for acute intracranial findings but he does have postoperative changes from previous brain surgery (for Moyamoya syndrome). Chest x-ray was within normal limits.   Assessment/Plan:   Principal Problem:  Sickle cell pain crisis  - Current pain regimen: Dilaudid 2 mg PO every 3 hours scheduled and then dilaudid 2 mg Q 3 hours IV PRN; today we will change frequency to every 4 hours for both pain meds; slow taper  Active Problems:  HIV (human immunodeficiency virus infection)  - CD4 count 600  Anemia of chronic disease  - Secondary to history of sickle cell disease  - Status post 1 unit of blood 03/09/2013 for hemoglobin of 6.5. Hemoglobin is 7.4 status post transfusion - CBC and BMP in am Leukocytosis  - likely reactive  Hyponatremia  - Likely secondary to dehydration  - BMP in am  Code Status: full code  Family Communication: no family at the bedside  Disposition Plan: home when stable   Manson Passey, MD  [facility]  Pager [phone]   Consultants:  ID Procedures:  None Antibiotics:  None    If 7PM-7AM, please contact night-coverage [email or web address] Password [email or web address] 03/12/2013, 6:30 AM   LOS: 4 days    HPI/Subjective: No acute overnight events.  Objective: Filed Vitals:   03/11/13 1721 03/11/13 2018 03/12/13 0158 03/12/13 0504  BP: 139/84 138/80  124/80 127/80  Pulse: 89 73 68 73  Temp: 98.7 F (37.1 C) 98.5 F (36.9 C) 98.7 F (37.1 C) 98.8 F (37.1 C)  TempSrc: Oral Oral Oral Oral  Resp: 16 16 16 16   Height:      Weight:      SpO2: 92% 90% 92% 91%    Intake/Output Summary (Last 24 hours) at 03/12/13 0630 Last data filed at 03/12/13 0453  Gross per 24 hour  Intake   1858 ml  Output    900 ml  Net    958 ml    Exam:   General:  Pt is alert, follows commands appropriately, not in acute distress  Cardiovascular: Regular rate and rhythm, S1/S2, no murmurs, no rubs, no gallops  Respiratory: Clear to auscultation bilaterally, no wheezing, no crackles, no rhonchi  Abdomen: Soft, non tender, non distended, bowel sounds present, no guarding  Extremities: No edema, pulses DP and PT palpable bilaterally  Neuro: Grossly nonfocal  Data Reviewed: Basic Metabolic Panel:  Recent Labs Lab 03/08/13 1707 03/09/13 0343 03/10/13 0250  NA 136 134* 132*  K 3.7 3.5 3.6  CL 102 104 97  CO2 28 27 27   GLUCOSE 87 95 90  BUN 6 5* 4*  CREATININE 0.70 0.60 0.51  CALCIUM 8.7 8.8 8.6   Liver Function Tests:  Recent Labs Lab 03/08/13 1707  AST 95*  ALT 34  ALKPHOS 77  BILITOT 9.1*  PROT 10.0*  ALBUMIN 3.8   No results found for this basename: LIPASE,  AMYLASE,  in the last 168 hours No results found for this basename: AMMONIA,  in the last 168 hours CBC:  Recent Labs Lab 03/08/13 1707 03/09/13 0343 03/10/13 0250 03/11/13 0412  WBC 9.3 11.8* 12.0* 10.2  NEUTROABS 4.1  --   --   --   HGB 7.3* 6.5* 7.0* 7.4*  HCT 20.6* 17.9* 19.1* 20.4*  MCV 87.3 84.8 80.9 83.6  PLT 380 315 280 296   Cardiac Enzymes: No results found for this basename: CKTOTAL, CKMB, CKMBINDEX, TROPONINI,  in the last 168 hours BNP: No components found with this basename: POCBNP,  CBG: No results found for this basename: GLUCAP,  in the last 168 hours  No results found for this or any previous visit (from the past 240 hour(s)).    Studies: No results found.  Scheduled Meds: . aspirin EC  81 mg Oral Daily  . HYDROmorphone  2 mg Oral Q3H   Continuous Infusions: . sodium chloride 75 mL/hr at 03/11/13 1941

## 2013-03-13 LAB — CBC
Platelets: 276 10*3/uL (ref 150–400)
RDW: 24.5 % — ABNORMAL HIGH (ref 11.5–15.5)
WBC: 9.2 10*3/uL (ref 4.0–10.5)

## 2013-03-13 LAB — BASIC METABOLIC PANEL
Calcium: 9.1 mg/dL (ref 8.4–10.5)
GFR calc Af Amer: >90 mL/min (ref >90)
GFR calc non Af Amer: >90 mL/min (ref >90)
Potassium: 3.1 mEq/L — ABNORMAL LOW (ref 3.5–5.1)
Sodium: 137 mEq/L (ref 135–145)

## 2013-03-13 MED ORDER — HYDROMORPHONE HCL 2 MG PO TABS
2.0000 mg | ORAL_TABLET | ORAL | Status: DC | PRN
  Administered 2013-03-13 (×4): 2 mg via ORAL
  Filled 2013-03-13 (×5): qty 1

## 2013-03-13 MED ORDER — PROMETHAZINE HCL 25 MG PO TABS
25.0000 mg | ORAL_TABLET | Freq: Once | ORAL | Status: AC
  Administered 2013-03-13: 25 mg via ORAL
  Filled 2013-03-13: qty 1

## 2013-03-13 NOTE — Progress Notes (Signed)
[facility] PROGRESS NOTE  Jeremy Mclaughlin FAO:[id number] DOB: 1986/03/25 DOA: 03/08/2013 PCP: No PCP Per Patient  Brief narrative: 27 -year-old male with past medical history of sickle cell disease, SS hemoglobin disease, HIV who presented to [facility] ED 03/08/2013 with worsening low back pain and lower extremity pain. Patient has had the symptoms over past 24 hours prior to this admission. He has experienced associated shortness of breath but no chest pain, no fevers or chills. No abdominal pain.  In ED, blood pressure is 113/62, HR 50, T 97.8 F and O2 saturation 89% on room air. CBC revealed hemoglobin of 7.3. CT head was negative for acute intracranial findings but he does have postoperative changes from previous brain surgery (for Moyamoya syndrome). Chest x-ray was within normal limits.   Assessment/Plan:   Principal Problem:  Sickle cell pain crisis  - Current pain regimen: Dilaudid 2 mg PO every 4 hours scheduled and then dilaudid 2 mg Q 3 hours IV PRN; today we will change frequency to every 4 hours for both pain meds; slow taper  Active Problems:  HIV (human immunodeficiency virus infection)  - CD4 count 600  Anemia of chronic disease  - Secondary to history of sickle cell disease  - Status post 1 unit of blood 03/09/2013 for hemoglobin of 6.5. Hemoglobin is 7.4 status post transfusion  - hemoglobin 7.8 today Leukocytosis  - likely reactive  - WBC count WNL Hyponatremia  - Likely secondary to dehydration  - resolved  Code Status: full code  Family Communication: no family at the bedside  Disposition Plan: home when stable   Manson Passey, MD  [facility]  Pager [phone]   Consultants:  ID Procedures:  None Antibiotics:  None    If 7PM-7AM, please contact night-coverage [email or web address] Password [facility] 03/13/2013, 7:39 AM   LOS: 5 days    HPI/Subjective: No acute overnight events. Still with pain.  Objective: Filed Vitals:   03/12/13 1700 03/12/13 2200 03/13/13 0214  03/13/13 0514  BP: 147/90 133/76 141/90 132/80  Pulse: 76 83 79 71  Temp: 98.1 F (36.7 C) 98.8 F (37.1 C) 98.6 F (37 C) 98.9 F (37.2 C)  TempSrc: Oral Oral Oral Oral  Resp: 18 16 18 16   Height:   5\' 5"  (1.651 m)   Weight:   64.8 kg (142 lb 13.7 oz)   SpO2: 92% 93% 94% 94%    Intake/Output Summary (Last 24 hours) at 03/13/13 0739 Last data filed at 03/13/13 0631  Gross per 24 hour  Intake    120 ml  Output   2300 ml  Net  -2180 ml    Exam:   General:  Pt is alert, follows commands appropriately, not in acute distress  Cardiovascular: Regular rate and rhythm, S1/S2, no murmurs, no rubs, no gallops  Respiratory: Clear to auscultation bilaterally, no wheezing, no crackles, no rhonchi  Abdomen: Soft, non tender, non distended, bowel sounds present, no guarding  Extremities: No edema, pulses DP and PT palpable bilaterally  Neuro: Grossly nonfocal  Data Reviewed: Basic Metabolic Panel:  Recent Labs Lab 03/08/13 1707 03/09/13 0343 03/10/13 0250 03/13/13 0414  NA 136 134* 132* 137  K 3.7 3.5 3.6 3.1*  CL 102 104 97 99  CO2 28 27 27  32  GLUCOSE 87 95 90 90  BUN 6 5* 4* 3*  CREATININE 0.70 0.60 0.51 0.47*  CALCIUM 8.7 8.8 8.6 9.1   Liver Function Tests:  Recent Labs Lab 03/08/13 1707  AST 95*  ALT 34  ALKPHOS 77  BILITOT 9.1*  PROT 10.0*  ALBUMIN 3.8   No results found for this basename: LIPASE, AMYLASE,  in the last 168 hours No results found for this basename: AMMONIA,  in the last 168 hours CBC:  Recent Labs Lab 03/08/13 1707 03/09/13 0343 03/10/13 0250 03/11/13 0412 03/13/13 0414  WBC 9.3 11.8* 12.0* 10.2 9.2  NEUTROABS 4.1  --   --   --   --   HGB 7.3* 6.5* 7.0* 7.4* 7.8*  HCT 20.6* 17.9* 19.1* 20.4* 22.9*  MCV 87.3 84.8 80.9 83.6 87.4  PLT 380 315 280 296 276   Cardiac Enzymes: No results found for this basename: CKTOTAL, CKMB, CKMBINDEX, TROPONINI,  in the last 168 hours BNP: No components found with this basename: POCBNP,   CBG: No results found for this basename: GLUCAP,  in the last 168 hours  No results found for this or any previous visit (from the past 240 hour(s)).   Studies: No results found.  Scheduled Meds: . aspirin EC  81 mg Oral Daily   Continuous Infusions:

## 2013-03-14 LAB — CBC
Hemoglobin: 7.6 g/dL — ABNORMAL LOW (ref 13.0–17.0)
MCHC: 34.1 g/dL (ref 30.0–36.0)
Platelets: 269 10*3/uL (ref 150–400)
RBC: 2.55 MIL/uL — ABNORMAL LOW (ref 4.22–5.81)

## 2013-03-14 MED ORDER — HYDROCODONE-ACETAMINOPHEN 5-325 MG PO TABS: 1.0000 | ORAL_TABLET | Freq: Four times a day (QID) | ORAL | Status: DC | PRN

## 2013-03-14 MED ORDER — HYDROMORPHONE HCL 2 MG PO TABS: 2.0000 mg | ORAL_TABLET | ORAL | Status: DC | PRN

## 2013-03-14 MED ORDER — PROMETHAZINE HCL 25 MG PO TABS: 25.0000 mg | ORAL_TABLET | Freq: Four times a day (QID) | ORAL | Status: DC | PRN

## 2013-03-14 NOTE — Discharge Summary (Signed)
Physician Discharge Summary  Jeremy Mclaughlin YNW:[id number] DOB: 07/26/1986 DOA: 03/08/2013  PCP: No PCP Per Patient  Admit date: 03/08/2013 Discharge date: 03/14/2013  Recommendations for Outpatient Follow-up:  1. Follow up with PCP in 1-2 weeks post discharge 2. Follow up in ID clinic per scheduled appointment   Discharge Diagnoses:  Principal Problem:   Sickle cell pain crisis Active Problems:   HIV (human immunodeficiency virus infection)   Anemia   Leukocytosis, unspecified   Hyponatremia   Discharge Condition: medically stable for discharge home today   Diet recommendation: as tolerated  History of present illness:  27 -year-old male with past medical history of sickle cell disease, SS hemoglobin disease, HIV who presented to [facility] ED 03/08/2013 with worsening low back pain and lower extremity pain. Patient has had the symptoms over past 24 hours prior to this admission. He has experienced associated shortness of breath but no chest pain, no fevers or chills. No abdominal pain.  In ED, blood pressure is 113/62, HR 50, T 97.8 F and O2 saturation 89% on room air. CBC revealed hemoglobin of 7.3. CT head was negative for acute intracranial findings but he does have postoperative changes from previous brain surgery (for Moyamoya syndrome). Chest x-ray was within normal limits.   Assessment/Plan:   Principal Problem:  Sickle cell pain crisis  - continue dilaudid 2 mg Q 4 hours PRN pain and percocet PRN breakthrough pain Active Problems:  HIV (human immunodeficiency virus infection)  - CD4 count 600  Anemia of chronic disease  - Secondary to history of sickle cell disease  - Status post 1 unit of blood 03/09/2013 for hemoglobin of 6.5. Hemoglobin is 7.4 status post transfusion  - hemoglobin 7.6 today  Leukocytosis  - likely reactive  - WBC count WNL  Hyponatremia  - Likely secondary to dehydration  - resolved   Code Status: full code  Family Communication: no family at the  bedside   Manson Passey, MD  [facility]  Pager [phone]   Consultants:  ID Procedures:  None Antibiotics:  None     Discharge Exam: Filed Vitals:   03/14/13 0950  BP: 141/85  Pulse: 68  Temp: 98.2 F (36.8 C)  Resp: 16   Filed Vitals:   03/13/13 2110 03/14/13 0128 03/14/13 0601 03/14/13 0950  BP: 140/89 132/76 114/69 141/85  Pulse: 60 59 62 68  Temp: 98.6 F (37 C) 98.2 F (36.8 C) 98.1 F (36.7 C) 98.2 F (36.8 C)  TempSrc: Oral Oral Oral Oral  Resp: 16 16 16 16   Height:      Weight:   62.6 kg (138 lb 0.1 oz)   SpO2: 97% 95% 100% 98%    General: Pt is alert, follows commands appropriately, not in acute distress Cardiovascular: Regular rate and rhythm, S1/S2 +, no murmurs, no rubs, no gallops Respiratory: Clear to auscultation bilaterally, no wheezing, no crackles, no rhonchi Abdominal: Soft, non tender, non distended, bowel sounds +, no guarding Extremities: no edema, no cyanosis, pulses palpable bilaterally DP and PT Neuro: Grossly nonfocal  Discharge Instructions  Discharge Orders   Future Appointments Provider Department Dept Phone   04/05/2013 9:00 AM Tomasita Morrow, RN [facility] for Infectious Disease [phone]   Future Orders Complete By Expires     Call MD for:  difficulty breathing, headache or visual disturbances  As directed     Call MD for:  persistant dizziness or light-headedness  As directed     Call MD for:  persistant nausea  and vomiting  As directed     Call MD for:  severe uncontrolled pain  As directed     Diet - low sodium heart healthy  As directed     Increase activity slowly  As directed         Medication List         aspirin EC 81 MG tablet  Take 81 mg by mouth daily.     FOLIC ACID PO  Take 25 mcg by mouth every evening.     HYDROcodone-acetaminophen 5-325 MG per tablet  Commonly known as:  NORCO/VICODIN  Take 1 tablet by mouth every 6 (six) hours as needed for pain.     HYDROmorphone 2 MG tablet  Commonly  known as:  DILAUDID  Take 1 tablet (2 mg total) by mouth every 4 (four) hours as needed for pain.     promethazine 25 MG tablet  Commonly known as:  PHENERGAN  Take 1 tablet (25 mg total) by mouth every 6 (six) hours as needed for nausea.           Follow-up Information   Follow up with No PCP Per Patient.   Contact information:   [address] [place] [address] [phone]        The results of significant diagnostics from this hospitalization (including imaging, microbiology, ancillary and laboratory) are listed below for reference.    Significant Diagnostic Studies: Dg Chest 2 View  03/08/2013   *RADIOLOGY REPORT*  Clinical Data: Back pain.  Sickle cell crisis.  CHEST - 2 VIEW  Comparison: None.  Findings: The heart size and pulmonary vascularity are normal and the lungs are clear.  No osseous abnormality.  IMPRESSION: Normal chest.   Original Report Authenticated By: Francene Boyers, M.D.   Ct Head Wo Contrast  03/08/2013   *RADIOLOGY REPORT*  Clinical Data: Sickle cell pain.  Headache.  CT HEAD WITHOUT CONTRAST  Technique:  Contiguous axial images were obtained from the base of the skull through the vertex without contrast.  Comparison: None.  Findings: Postoperative changes from prior right craniotomy. Underlying right frontal encephalomalacia.  No acute intracranial abnormality.  Specifically, no hemorrhage, hydrocephalus, mass lesion, acute infarction, or significant intracranial injury.  No acute calvarial abnormality. Visualized paranasal sinuses and mastoids clear.  Orbital soft tissues unremarkable.  IMPRESSION: Postoperative changes with right frontal encephalomalacia. No acute intracranial abnormality.   Original Report Authenticated By: Charlett Nose, M.D.    Microbiology: No results found for this or any previous visit (from the past 240 hour(s)).   Labs: Basic Metabolic Panel:  Recent Labs Lab 03/08/13 1707 03/09/13 0343 03/10/13 0250 03/13/13 0414  NA  136 134* 132* 137  K 3.7 3.5 3.6 3.1*  CL 102 104 97 99  CO2 28 27 27  32  GLUCOSE 87 95 90 90  BUN 6 5* 4* 3*  CREATININE 0.70 0.60 0.51 0.47*  CALCIUM 8.7 8.8 8.6 9.1   Liver Function Tests:  Recent Labs Lab 03/08/13 1707  AST 95*  ALT 34  ALKPHOS 77  BILITOT 9.1*  PROT 10.0*  ALBUMIN 3.8   No results found for this basename: LIPASE, AMYLASE,  in the last 168 hours No results found for this basename: AMMONIA,  in the last 168 hours CBC:  Recent Labs Lab 03/08/13 1707 03/09/13 0343 03/10/13 0250 03/11/13 0412 03/13/13 0414 03/14/13 0405  WBC 9.3 11.8* 12.0* 10.2 9.2 8.7  NEUTROABS 4.1  --   --   --   --   --  HGB 7.3* 6.5* 7.0* 7.4* 7.8* 7.6*  HCT 20.6* 17.9* 19.1* 20.4* 22.9* 22.3*  MCV 87.3 84.8 80.9 83.6 87.4 87.5  PLT 380 315 280 296 276 269   Cardiac Enzymes: No results found for this basename: CKTOTAL, CKMB, CKMBINDEX, TROPONINI,  in the last 168 hours BNP: BNP (last 3 results) No results found for this basename: PROBNP,  in the last 8760 hours CBG: No results found for this basename: GLUCAP,  in the last 168 hours  Time coordinating discharge: Over 30 minutes  Signed:  Manson Passey, MD  [facility]  03/14/2013, 11:38 AM  Pager #: [phone]

## 2013-03-14 NOTE — Progress Notes (Signed)
Pt. Sleeping most the morning, denies any pain. He has been discharged. Discharge instructions explained to pt and precriptions given to pt.

## 2013-04-05 ENCOUNTER — Ambulatory Visit: Payer: Medicaid Other

## 2013-04-05 ENCOUNTER — Ambulatory Visit (INDEPENDENT_AMBULATORY_CARE_PROVIDER_SITE_OTHER): Payer: Medicaid Other

## 2013-04-05 DIAGNOSIS — B20 Human immunodeficiency virus [HIV] disease: Secondary | ICD-10-CM

## 2013-04-05 NOTE — Progress Notes (Signed)
Patient was not able to have labs done today due to problem with his Medicaid being inactive.   We will check with CCN, Case Manager , Cassandra to see if she has started paperwork. If so I will call the patient back for labs only.    Medical records information available in Care Everywhere at [facility] Infectious Disease  Osie Amparo Gorden Harms, RN

## 2013-04-28 ENCOUNTER — Telehealth: Payer: Self-pay | Admitting: *Deleted

## 2013-04-28 ENCOUNTER — Ambulatory Visit: Payer: Self-pay

## 2013-04-28 ENCOUNTER — Ambulatory Visit: Payer: Medicaid Other | Admitting: Internal Medicine

## 2013-04-28 NOTE — Telephone Encounter (Signed)
Called patient to about his no show today and had to leave a message for him to call the clinic to reschedule asap.

## 2013-05-05 ENCOUNTER — Telehealth (HOSPITAL_COMMUNITY): Payer: Self-pay

## 2013-05-05 NOTE — Telephone Encounter (Signed)
This CM called patient at [phone] and left message for a call back.

## 2013-05-09 ENCOUNTER — Inpatient Hospital Stay (HOSPITAL_COMMUNITY)
Admission: EM | Admit: 2013-05-09 | Discharge: 2013-05-18 | DRG: 812 | Disposition: A | Discharge status: Home / Self Care | Payer: Medicaid Other | Attending: Internal Medicine | Admitting: Internal Medicine

## 2013-05-09 ENCOUNTER — Encounter (HOSPITAL_COMMUNITY): Payer: Self-pay

## 2013-05-09 DIAGNOSIS — D649 Anemia, unspecified: Secondary | ICD-10-CM | POA: Diagnosis present

## 2013-05-09 DIAGNOSIS — D5700 Hb-SS disease with crisis, unspecified: Principal | ICD-10-CM | POA: Diagnosis present

## 2013-05-09 DIAGNOSIS — H6120 Impacted cerumen, unspecified ear: Secondary | ICD-10-CM | POA: Diagnosis present

## 2013-05-09 DIAGNOSIS — F17200 Nicotine dependence, unspecified, uncomplicated: Secondary | ICD-10-CM | POA: Diagnosis present

## 2013-05-09 DIAGNOSIS — E876 Hypokalemia: Secondary | ICD-10-CM | POA: Diagnosis present

## 2013-05-09 DIAGNOSIS — B20 Human immunodeficiency virus [HIV] disease: Secondary | ICD-10-CM

## 2013-05-09 DIAGNOSIS — Z79899 Other long term (current) drug therapy: Secondary | ICD-10-CM

## 2013-05-09 DIAGNOSIS — D72829 Elevated white blood cell count, unspecified: Secondary | ICD-10-CM | POA: Diagnosis present

## 2013-05-09 DIAGNOSIS — E871 Hypo-osmolality and hyponatremia: Secondary | ICD-10-CM

## 2013-05-09 DIAGNOSIS — Z8679 Personal history of other diseases of the circulatory system: Secondary | ICD-10-CM

## 2013-05-09 DIAGNOSIS — Z21 Asymptomatic human immunodeficiency virus [HIV] infection status: Secondary | ICD-10-CM | POA: Diagnosis present

## 2013-05-09 LAB — CBC WITH DIFFERENTIAL/PLATELET
Basophils Absolute: 0.1 10*3/uL (ref 0.0–0.1)
Basophils Relative: 1 % (ref 0–1)
Eosinophils Absolute: 0.2 10*3/uL (ref 0.0–0.7)
Eosinophils Relative: 2 % (ref 0–5)
HCT: 19.1 % — ABNORMAL LOW (ref 39.0–52.0)
Hemoglobin: 6.8 g/dL — CL (ref 13.0–17.0)
Lymphocytes Relative: 31 % (ref 12–46)
Lymphs Abs: 4.1 10*3/uL — ABNORMAL HIGH (ref 0.7–4.0)
MCH: 31.9 pg (ref 26.0–34.0)
MCHC: 35.6 g/dL (ref 30.0–36.0)
MCV: 89.7 fL (ref 78.0–100.0)
Monocytes Absolute: 1 10*3/uL (ref 0.1–1.0)
Monocytes Relative: 8 % (ref 3–12)
Neutro Abs: 7.8 10*3/uL — ABNORMAL HIGH (ref 1.7–7.7)
Neutrophils Relative %: 59 % (ref 43–77)
Platelets: 353 10*3/uL (ref 150–400)
RBC: 2.13 MIL/uL — ABNORMAL LOW (ref 4.22–5.81)
RDW: 23.1 % — ABNORMAL HIGH (ref 11.5–15.5)
WBC: 13.2 10*3/uL — ABNORMAL HIGH (ref 4.0–10.5)

## 2013-05-09 LAB — COMPREHENSIVE METABOLIC PANEL
ALT: 30 U/L (ref 0–53)
AST: 67 U/L — ABNORMAL HIGH (ref 0–37)
Albumin: 3.6 g/dL (ref 3.5–5.2)
Alkaline Phosphatase: 69 U/L (ref 39–117)
BUN: <3 mg/dL — ABNORMAL LOW (ref 6–23)
CO2: 28 mEq/L (ref 19–32)
Calcium: 8.9 mg/dL (ref 8.4–10.5)
Chloride: 102 mEq/L (ref 96–112)
Creatinine, Ser: 0.55 mg/dL (ref 0.50–1.35)
GFR calc Af Amer: >90 mL/min (ref >90)
GFR calc non Af Amer: >90 mL/min (ref >90)
Glucose, Bld: 86 mg/dL (ref 70–99)
Potassium: 4.4 mEq/L (ref 3.5–5.1)
Sodium: 134 mEq/L — ABNORMAL LOW (ref 135–145)
Total Bilirubin: 9.1 mg/dL — ABNORMAL HIGH (ref 0.3–1.2)
Total Protein: 10 g/dL — ABNORMAL HIGH (ref 6.0–8.3)

## 2013-05-09 LAB — RETICULOCYTES
RBC.: 2.13 MIL/uL — ABNORMAL LOW (ref 4.22–5.81)
Retic Count, Absolute: 587.9 10*3/uL — ABNORMAL HIGH (ref 19.0–186.0)
Retic Ct Pct: 27.6 % — ABNORMAL HIGH (ref 0.4–3.1)

## 2013-05-09 MED ORDER — HYDROMORPHONE HCL PF 2 MG/ML IJ SOLN
2.0000 mg | Freq: Once | INTRAMUSCULAR | Status: AC
  Administered 2013-05-09: 2 mg via INTRAVENOUS
  Filled 2013-05-09: qty 1

## 2013-05-09 MED ORDER — DIPHENHYDRAMINE HCL 50 MG/ML IJ SOLN
12.5000 mg | INTRAMUSCULAR | Status: DC | PRN
  Administered 2013-05-10 – 2013-05-16 (×2): 25 mg via INTRAVENOUS
  Filled 2013-05-09 (×2): qty 1

## 2013-05-09 MED ORDER — PROMETHAZINE HCL 25 MG RE SUPP: 12.5000 mg | RECTAL | Status: DC | PRN

## 2013-05-09 MED ORDER — ASPIRIN EC 81 MG PO TBEC
81.0000 mg | DELAYED_RELEASE_TABLET | Freq: Every day | ORAL | Status: DC
  Administered 2013-05-09 – 2013-05-18 (×9): 81 mg via ORAL
  Filled 2013-05-09 (×10): qty 1

## 2013-05-09 MED ORDER — FOLIC ACID 20 MG PO CAPS: 25.0000 mg | ORAL_CAPSULE | Freq: Every day | ORAL | Status: DC

## 2013-05-09 MED ORDER — PROMETHAZINE HCL 25 MG PO TABS: 12.5000 mg | ORAL_TABLET | ORAL | Status: DC | PRN

## 2013-05-09 MED ORDER — HYDROMORPHONE HCL PF 2 MG/ML IJ SOLN
2.0000 mg | INTRAMUSCULAR | Status: DC | PRN
  Administered 2013-05-09 – 2013-05-10 (×4): 2 mg via INTRAVENOUS
  Administered 2013-05-10: 1 mg via INTRAVENOUS
  Administered 2013-05-10 (×4): 2 mg via INTRAVENOUS
  Administered 2013-05-11: 3 mg via INTRAVENOUS
  Administered 2013-05-11 – 2013-05-15 (×35): 2 mg via INTRAVENOUS
  Administered 2013-05-15: 3 mg via INTRAVENOUS
  Administered 2013-05-15 (×2): 2 mg via INTRAVENOUS
  Administered 2013-05-15: 3 mg via INTRAVENOUS
  Administered 2013-05-15 (×3): 2 mg via INTRAVENOUS
  Administered 2013-05-16 (×4): 3 mg via INTRAVENOUS
  Filled 2013-05-09: qty 2
  Filled 2013-05-09 (×5): qty 1
  Filled 2013-05-09: qty 2
  Filled 2013-05-09 (×2): qty 1
  Filled 2013-05-09: qty 2
  Filled 2013-05-09 (×2): qty 1
  Filled 2013-05-09: qty 2
  Filled 2013-05-09 (×16): qty 1
  Filled 2013-05-09: qty 2
  Filled 2013-05-09 (×13): qty 1
  Filled 2013-05-09: qty 2
  Filled 2013-05-09 (×2): qty 1
  Filled 2013-05-09: qty 2
  Filled 2013-05-09 (×4): qty 1
  Filled 2013-05-09: qty 2
  Filled 2013-05-09 (×4): qty 1

## 2013-05-09 MED ORDER — HYDROMORPHONE HCL PF 2 MG/ML IJ SOLN
2.0000 mg | INTRAMUSCULAR | Status: DC | PRN
  Administered 2013-05-09: 2 mg via INTRAVENOUS
  Filled 2013-05-09: qty 1

## 2013-05-09 MED ORDER — DIPHENHYDRAMINE HCL 25 MG PO CAPS: 25.0000 mg | ORAL_CAPSULE | ORAL | Status: DC | PRN

## 2013-05-09 MED ORDER — FOLIC ACID 1 MG PO TABS: 1.0000 mg | ORAL_TABLET | Freq: Every day | ORAL | Status: DC

## 2013-05-09 MED ORDER — FOLIC ACID 1 MG PO TABS
1.0000 mg | ORAL_TABLET | Freq: Every day | ORAL | Status: DC
  Administered 2013-05-10 – 2013-05-18 (×8): 1 mg via ORAL
  Filled 2013-05-09 (×9): qty 1

## 2013-05-09 MED ORDER — PROMETHAZINE HCL 25 MG PO TABS
25.0000 mg | ORAL_TABLET | ORAL | Status: DC | PRN
  Administered 2013-05-09 – 2013-05-18 (×16): 25 mg via ORAL
  Filled 2013-05-09 (×19): qty 1

## 2013-05-09 MED ORDER — HYDROCODONE-ACETAMINOPHEN 5-325 MG PO TABS
1.0000 | ORAL_TABLET | ORAL | Status: DC | PRN
  Administered 2013-05-09 – 2013-05-17 (×3): 2 via ORAL
  Filled 2013-05-09 (×3): qty 2

## 2013-05-09 MED ORDER — ONDANSETRON HCL 4 MG/2ML IJ SOLN
4.0000 mg | Freq: Four times a day (QID) | INTRAMUSCULAR | Status: DC | PRN
  Administered 2013-05-09 – 2013-05-11 (×5): 4 mg via INTRAVENOUS
  Filled 2013-05-09 (×5): qty 2

## 2013-05-09 MED ORDER — SODIUM CHLORIDE 0.9 % IV BOLUS (SEPSIS)
2000.0000 mL | Freq: Once | INTRAVENOUS | Status: AC
  Administered 2013-05-09: 2000 mL via INTRAVENOUS

## 2013-05-09 MED ORDER — ENOXAPARIN SODIUM 40 MG/0.4ML ~~LOC~~ SOLN
40.0000 mg | SUBCUTANEOUS | Status: DC
  Filled 2013-05-09 (×2): qty 0.4

## 2013-05-09 MED ORDER — SODIUM CHLORIDE 0.9 % IV SOLN
INTRAVENOUS | Status: DC
  Administered 2013-05-09 – 2013-05-10 (×2): via INTRAVENOUS
  Administered 2013-05-10: 1000 mL via INTRAVENOUS

## 2013-05-09 NOTE — Progress Notes (Signed)
Utilization Review completed.  Nyeemah Jennette RN CM  

## 2013-05-09 NOTE — ED Notes (Signed)
Patient c/o sickle cell pain mostly in his lower back, chest, and bilateral lower extremities. Patient denies SOB.

## 2013-05-09 NOTE — ED Provider Notes (Signed)
CSN: [id number]     Arrival date & time 05/09/13  1041 History     First MD Initiated Contact with Patient 05/09/13 1215     Chief Complaint  Patient presents with  . Sickle Cell Pain Crisis   (Consider location/radiation/quality/duration/timing/severity/associated sxs/prior Treatment) HPI Patient presents emergency department with sickle cell crisis.  Patient, states, that he has not had a crisis in several months.  Patient, states his normal home medications generally help with his pain.  Patient denies chest pain, shortness of breath, fever, cough, headache, blurred vision, nausea, vomiting, abdominal pain, dizziness, weakness, or syncope.  Patient, states, that nothing seems to make his condition, better or worse.  Patient, states this feels, like his previous sickle cell crisis.  He also, states, that when he does have a crisis he usually has to be admitted Past Medical History  Diagnosis Date  . Sickle cell anemia   . Immune deficiency disorder   . HIV (human immunodeficiency virus infection)   . Shingles    Past Surgical History  Procedure Laterality Date  . Brain surgery    . Cholecystectomy     Family History  Problem Relation Age of Onset  . Heart disease Father   . Hypertension Maternal Aunt   . Diabetes Maternal Aunt   . Cancer Maternal Grandmother    History  Substance Use Topics  . Smoking status: Current Some Day Smoker    Types: Cigarettes  . Smokeless tobacco: Never Used  . Alcohol Use: Yes     Comment: socially    Review of Systems All other systems negative except as documented in the HPI. All pertinent positives and negatives as reviewed in the HPI. Allergies  Banana; Other; Morphine and related; and Adhesive  Home Medications   Current Outpatient Rx  Name  Route  Sig  Dispense  Refill  . aspirin EC 81 MG tablet   Oral   Take 81 mg by mouth daily.         . DiphenhydrAMINE HCl (BENADRYL ALLERGY PO)   Oral   Take 1 tablet by mouth daily as  needed (allergies).         . folic acid (FOLVITE) 1 MG tablet      1 mg. Take 1 tablet (1 mg total) by mouth daily.         . Folic Acid 20 MG CAPS   Oral   Take 25 mg by mouth daily.         Marland Kitchen HYDROcodone-acetaminophen (NORCO/VICODIN) 5-325 MG per tablet   Oral   Take 1 tablet by mouth every 6 (six) hours as needed for pain.   60 tablet   0   . promethazine (PHENERGAN) 25 MG tablet   Oral   Take 1 tablet (25 mg total) by mouth every 6 (six) hours as needed for nausea.   60 tablet   0   . HYDROmorphone (DILAUDID) 2 MG tablet   Oral   Take 1 tablet (2 mg total) by mouth every 4 (four) hours as needed for pain.   60 tablet   0    BP 126/67  Pulse 74  Temp(Src) 98.7 F (37.1 C)  Resp 15  Ht 5\' 6"  (1.676 m)  Wt 134 lb (60.782 kg)  BMI 21.64 kg/m2  SpO2 99% Physical Exam  Nursing note and vitals reviewed. Constitutional: He is oriented to person, place, and time. He appears well-developed and well-nourished. He appears distressed.  HENT:  Head: Normocephalic and  atraumatic.  Mouth/Throat: Oropharynx is clear and moist.  Eyes: Pupils are equal, round, and reactive to light.  Neck: Normal range of motion. Neck supple.  Cardiovascular: Normal rate, regular rhythm and normal heart sounds.  Exam reveals no gallop and no friction rub.   No murmur heard. Pulmonary/Chest: Effort normal and breath sounds normal. No respiratory distress.  Abdominal: Soft. Bowel sounds are normal. He exhibits no distension. There is no tenderness. There is no guarding.  Musculoskeletal: He exhibits no edema.  Neurological: He is alert and oriented to person, place, and time. No cranial nerve deficit. He exhibits normal muscle tone. Coordination normal.  Skin: Skin is warm and dry. No rash noted. No erythema.    ED Course   Procedures (including critical care time)  Labs Reviewed  CBC WITH DIFFERENTIAL - Abnormal; Notable for the following:    WBC 13.2 (*)    RBC 2.13 (*)     Hemoglobin 6.8 (*)    HCT 19.1 (*)    RDW 23.1 (*)    Neutro Abs 7.8 (*)    Lymphs Abs 4.1 (*)    All other components within normal limits  COMPREHENSIVE METABOLIC PANEL - Abnormal; Notable for the following:    Sodium 134 (*)    BUN <3 (*)    Total Protein 10.0 (*)    AST 67 (*)    Total Bilirubin 9.1 (*)    All other components within normal limits  RETICULOCYTES - Abnormal; Notable for the following:    Retic Ct Pct 27.6 (*)    RBC. 2.13 (*)    Retic Count, Manual 587.9 (*)    All other components within normal limits   No results found. 1. Sickle cell disease with crisis    patient will be admitted to the hospital after several rounds of pain medication and IV fluids.  Patient was also, placed on high flow oxygen by nasal cannula.  Patient, states, that he is not feeling any better.  Will speak with the [facility] about admission for the patient  MDM  MDM Reviewed: vitals, nursing note and previous chart Reviewed previous: labs Interpretation: labs Consults: admitting MD      Carlyle Dolly, PA-C 05/12/13 1524

## 2013-05-09 NOTE — Progress Notes (Signed)
Pt does not feel like signing up for My Chart at present time. Briscoe Burns BSN, RN-BC Admissions RN  05/09/2013 3:35 PM

## 2013-05-09 NOTE — H&P (Signed)
[facility] History and Physical  Jeremy Mclaughlin ZOX:[id number] DOB: 05/23/1986 DOA: 05/09/2013  Referring physician: ED physician PCP: No PCP Per Patient   Chief Complaint: lower back and bilateral lower extremity pain, sickle cell crisis  HPI:  27 -year-old male with past medical history of sickle cell disease, SS hemoglobin disease, HIV who presented to [facility] ED with worsening low back pain and lower extremity pain that started several days prior to this admission, throbbing and constant, 10/10 in severity with no specific alleviating or aggravating factors, no specific radiating symptoms. Pt explains this is typical of his sickle cell crisis. He denies fevers, chills, shortness of breath, no chest pain, no specific abdominal or urinary concerns.   In ED, blood pressure is 121/69, HR 70, T 98.6 F and O2 saturation 100% on room air. CBC revealed hemoglobin of 6.8. Chest x-ray was within normal limits. [facility] asked to admit for further evaluation.   Assessment/Plan:  Principal Problem:  Sickle cell pain crisis  - will admit to medical floor - provide supportive care with IVF, analgesia and antiemetics as needed - oxygen as needed - k-pad to the affected area  Active Problems:  HIV (human immunodeficiency virus infection)  - CD4 count 600  Anemia of chronic disease  - Secondary to history of sickle cell disease  - will hold off on blood transfusion since HG is ~ his baseline - CBC in AM and transfuse if Hg < 6.5 Leukocytosis  - likely reactive  - check CBC in AM  Code Status: full code  Family Communication: no family at the bedside   Consultants:  None Procedures:  None Antibiotics:  None   Review of Systems:  Constitutional: Negative for fever, chills. Negative for diaphoresis.  HENT: Negative for hearing loss, ear pain, nosebleeds, congestion, sore throat, neck pain, tinnitus and ear discharge.   Eyes: Negative for blurred vision, double vision, photophobia, pain,  discharge and redness.  Respiratory: Negative for cough, hemoptysis, sputum production, shortness of breath, wheezing and stridor.   Cardiovascular: Negative for palpitations, orthopnea, claudication and leg swelling.  Gastrointestinal: Negative for nausea, vomiting and abdominal pain. Negative for heartburn, constipation, blood in stool and melena.  Genitourinary: Negative for dysuria, urgency, frequency, hematuria and flank pain.  Musculoskeletal: Negative for and falls.  Skin: Negative for itching and rash.  Neurological: Negative for tingling, tremors, sensory change, speech change, focal weakness, loss of consciousness and headaches.  Endo/Heme/Allergies: Negative for environmental allergies and polydipsia. Does not bruise/bleed easily.  Psychiatric/Behavioral: Negative for suicidal ideas. The patient is not nervous/anxious.      Past Medical History  Diagnosis Date  . Sickle cell anemia   . Immune deficiency disorder   . HIV (human immunodeficiency virus infection)   . Shingles     Past Surgical History  Procedure Laterality Date  . Brain surgery    . Cholecystectomy      Social History:  reports that he has been smoking Cigarettes.  He has been smoking about 0.00 packs per day. He has never used smokeless tobacco. He reports that  drinks alcohol. He reports that he does not use illicit drugs.  Allergies  Allergen Reactions  . Banana Anaphylaxis  . Other Anaphylaxis and Rash    melons  . Morphine And Related     Doesn't work for pt  . Adhesive [Tape] Rash    Family History  Problem Relation Age of Onset  . Heart disease Father   . Hypertension Maternal Aunt   .  Diabetes Maternal Aunt   . Cancer Maternal Grandmother     Prior to Admission medications   Medication Sig Start Date End Date Taking? Authorizing Provider  aspirin EC 81 MG tablet Take 81 mg by mouth daily.   Yes Historical Provider, MD  DiphenhydrAMINE HCl (BENADRYL ALLERGY PO) Take 1 tablet by mouth  daily as needed (allergies).   Yes Historical Provider, MD  folic acid (FOLVITE) 1 MG tablet 1 mg. Take 1 tablet (1 mg total) by mouth daily. 11/08/12  Yes Historical Provider, MD  Folic Acid 20 MG CAPS Take 25 mg by mouth daily.   Yes Historical Provider, MD  HYDROcodone-acetaminophen (NORCO/VICODIN) 5-325 MG per tablet Take 1 tablet by mouth every 6 (six) hours as needed for pain. 03/14/13  Yes Alison Murray, MD  promethazine (PHENERGAN) 25 MG tablet Take 1 tablet (25 mg total) by mouth every 6 (six) hours as needed for nausea. 03/14/13  Yes Alison Murray, MD  HYDROmorphone (DILAUDID) 2 MG tablet Take 1 tablet (2 mg total) by mouth every 4 (four) hours as needed for pain. 03/14/13   Alison Murray, MD    Physical Exam: Filed Vitals:   05/09/13 1400 05/09/13 1430 05/09/13 1521 05/09/13 1530  BP: 121/69 133/69 123/72 126/67  Pulse:  74 71 74  Temp:      Resp: 16 13 15 15   Height:      Weight:      SpO2:  93% 99% 99%    Physical Exam  Constitutional: Appears well-developed and well-nourished. No distress.  HENT: Normocephalic. External right and left ear normal. Oropharynx is clear and moist.  Eyes: Conjunctivae and EOM are normal. PERRLA, no scleral icterus.  Neck: Normal ROM. Neck supple. No JVD. No tracheal deviation. No thyromegaly.  CVS: RRR, S1/S2 +, no murmurs, no gallops, no carotid bruit.  Pulmonary: Effort and breath sounds normal, no stridor, rhonchi, wheezes, rales.  Abdominal: Soft. BS +,  no distension, tenderness, rebound or guarding.  Musculoskeletal: Normal range of motion. No edema and no tenderness.  Lymphadenopathy: No lymphadenopathy noted, cervical, inguinal. Neuro: Alert. Normal reflexes, muscle tone coordination. No cranial nerve deficit. Skin: Skin is warm and dry. No rash noted. Not diaphoretic. No erythema. No pallor.  Psychiatric: Normal mood and affect. Behavior, judgment, thought content normal.   Labs on Admission:  Basic Metabolic Panel:  Recent  Labs Lab 05/09/13 1235  NA 134*  K 4.4  CL 102  CO2 28  GLUCOSE 86  BUN <3*  CREATININE 0.55  CALCIUM 8.9   Liver Function Tests:  Recent Labs Lab 05/09/13 1235  AST 67*  ALT 30  ALKPHOS 69  BILITOT 9.1*  PROT 10.0*  ALBUMIN 3.6   CBC:  Recent Labs Lab 05/09/13 1235  WBC 13.2*  NEUTROABS 7.8*  HGB 6.8*  HCT 19.1*  MCV 89.7  PLT 353   Radiological Exams on Admission: No results found.  EKG: Normal sinus rhythm, no ST/T wave changes  Debbora Presto, MD  [facility] Pager [phone]  If 7PM-7AM, please contact night-coverage [email or web address] Password [facility] 05/09/2013, 4:09 PM

## 2013-05-10 ENCOUNTER — Ambulatory Visit: Payer: Medicaid Other | Admitting: Internal Medicine

## 2013-05-10 DIAGNOSIS — Z21 Asymptomatic human immunodeficiency virus [HIV] infection status: Secondary | ICD-10-CM

## 2013-05-10 LAB — COMPREHENSIVE METABOLIC PANEL
ALT: 27 U/L (ref 0–53)
Albumin: 3.2 g/dL — ABNORMAL LOW (ref 3.5–5.2)
Alkaline Phosphatase: 68 U/L (ref 39–117)
BUN: 4 mg/dL — ABNORMAL LOW (ref 6–23)
Chloride: 101 mEq/L (ref 96–112)
GFR calc Af Amer: >90 mL/min (ref >90)
Glucose, Bld: 96 mg/dL (ref 70–99)
Potassium: 3.5 mEq/L (ref 3.5–5.1)
Sodium: 132 mEq/L — ABNORMAL LOW (ref 135–145)
Total Bilirubin: 8.1 mg/dL — ABNORMAL HIGH (ref 0.3–1.2)
Total Protein: 8.8 g/dL — ABNORMAL HIGH (ref 6.0–8.3)

## 2013-05-10 LAB — PREPARE RBC (CROSSMATCH)

## 2013-05-10 LAB — CBC
HCT: 14.8 % — ABNORMAL LOW (ref 39.0–52.0)
Hemoglobin: 5.4 g/dL — CL (ref 13.0–17.0)
RBC: 1.68 MIL/uL — ABNORMAL LOW (ref 4.22–5.81)
WBC: 13.7 10*3/uL — ABNORMAL HIGH (ref 4.0–10.5)

## 2013-05-10 MED ORDER — HYDROMORPHONE HCL PF 1 MG/ML IJ SOLN
INTRAMUSCULAR | Status: AC
  Filled 2013-05-10: qty 2

## 2013-05-10 MED ORDER — IBUPROFEN 800 MG PO TABS
800.0000 mg | ORAL_TABLET | Freq: Once | ORAL | Status: AC
  Administered 2013-05-10: 800 mg via ORAL
  Filled 2013-05-10: qty 1

## 2013-05-10 MED ORDER — PROMETHAZINE HCL 25 MG/ML IJ SOLN
12.5000 mg | Freq: Once | INTRAMUSCULAR | Status: AC
  Administered 2013-05-10: 12.5 mg via INTRAVENOUS
  Filled 2013-05-10: qty 1

## 2013-05-10 NOTE — Progress Notes (Signed)
[facility] PROGRESS NOTE  Jeremy Mclaughlin YQM:[id number] DOB: 08/13/1986 DOA: 05/09/2013 PCP: No PCP Per Patient  Brief narrative: 27 -year-old male with past medical history of sickle cell disease, SS hemoglobin disease, HIV (CD4 in 02/2013 600) who presented to [facility] ED with worsening low back pain and lower extremity pain that started several days prior to this admission typical for his sickle cell pain crisis.  In ED, blood pressure was 121/69, HR 70, T 98.6 F and O2 saturation 100% on room air. CBC revealed hemoglobin of 6.8, white blood cell count of 13.2 and a normal platelet count. Chest x-ray was within normal limits.   Assessment/Plan:   Principal Problem:  Sickle cell pain crisis  - Current pain management: Dilaudid 2 mg every 2 hours as needed IV for severe pain, Norco 2 tablets every 4 hours as needed by mouth for moderate pain  - Will wean IV narcotics when pain rated 7/10. Today 9/10. - When able to tolerate oral therapy, will convert at 50-75% of IV dose & give PRNs Q 2-3 hours.  - Monitor CBC daily as hemoglobin was 6.8 and subsequently 5.4. Patient will receive blood transfusion one unit tonight when blood is available. - No previous recent LDH available. Obtain LDH today and monitor every 72 hours. - Follow up ferritin level. - Will check CMP (which will include bilirubin ) in am - Continue IVF  - Continue folic acid. Not on hydrea.  Active Problems:  HIV (human immunodeficiency virus infection)  - CD4 count 600; not on HAART - check CD4 today Anemia of chronic disease  - Secondary to history of sickle cell disease  - hemoglobin 5.4 so will receive PRBC transfusion once blood available. Leukocytosis  - likely reactive  - WBC count 13.2 - 13.7 - CXR WNL  Code Status: full code Family Communication: no family at the bedside Disposition Plan: home when stable  Manson Passey, MD  [facility] Pager [phone]  If 7PM-7AM, please contact  night-coverage [email or web address] Password [email or web address] 05/10/2013, 12:18 PM   LOS: 1 day   Consultants:  None   Procedures:  None   Antibiotics:  None   HPI/Subjective: Still has generalized pain, 9/10.  Objective: Filed Vitals:   05/10/13 0205 05/10/13 0626 05/10/13 0655 05/10/13 1009  BP: 125/73  128/70 126/71  Pulse: 66 64 73 94  Temp: 98.1 F (36.7 C)  98.4 F (36.9 C) 98.4 F (36.9 C)  TempSrc: Oral  Oral Oral  Resp: 16  20 16   Height:      Weight:   63.5 kg (139 lb 15.9 oz)   SpO2: 100% 94% 90% 94%    Intake/Output Summary (Last 24 hours) at 05/10/13 1218 Last data filed at 05/10/13 0525  Gross per 24 hour  Intake 1698.75 ml  Output    925 ml  Net 773.75 ml    Exam:   General:  Pt is alert, follows commands appropriately, not in acute distress  Cardiovascular: Regular rate and rhythm, S1/S2, no murmurs, no rubs, no gallops  Respiratory: Clear to auscultation bilaterally, no wheezing, no crackles, no rhonchi  Abdomen: Soft, non tender, non distended, bowel sounds present, no guarding  Extremities: No edema, pulses DP and PT palpable bilaterally  Neuro: Grossly nonfocal  Data Reviewed: Basic Metabolic Panel:  Recent Labs Lab 05/09/13 1235 05/10/13 0403  NA 134* 132*  K 4.4 3.5  CL 102 101  CO2 28 29  GLUCOSE 86 96  BUN <3* 4*  CREATININE 0.55 0.58  CALCIUM 8.9 8.3*   Liver Function Tests:  Recent Labs Lab 05/09/13 1235 05/10/13 0403  AST 67* 57*  ALT 30 27  ALKPHOS 69 68  BILITOT 9.1* 8.1*  PROT 10.0* 8.8*  ALBUMIN 3.6 3.2*   No results found for this basename: LIPASE, AMYLASE,  in the last 168 hours No results found for this basename: AMMONIA,  in the last 168 hours CBC:  Recent Labs Lab 05/09/13 1235 05/10/13 0403  WBC 13.2* 13.7*  NEUTROABS 7.8*  --   HGB 6.8* 5.4*  HCT 19.1* 14.8*  MCV 89.7 88.1  PLT 353 254   Cardiac Enzymes: No results found for this basename: CKTOTAL, CKMB, CKMBINDEX, TROPONINI,  in the last 168  hours BNP: No components found with this basename: POCBNP,  CBG: No results found for this basename: GLUCAP,  in the last 168 hours  No results found for this or any previous visit (from the past 240 hour(s)).   Studies: No results found.  Scheduled Meds: . aspirin EC  81 mg Oral Daily  . folic acid  1 mg Oral Daily   Continuous Infusions: . sodium chloride 1,000 mL (05/10/13 0521)

## 2013-05-10 NOTE — Plan of Care (Signed)
Problem: Phase I Progression Outcomes Goal: Pulmonary Hygiene as Indicated (Sickle Cell) Outcome: Progressing Given IS and instructed on use

## 2013-05-10 NOTE — Progress Notes (Addendum)
CRITICAL VALUE ALERT  Critical value received:  hgb 5.4  Date of notification:  05/09/13  Time of notification:  0417  Critical value read back:yes  Nurse who received alert:  Greig Right   MD notified (1st page):  Merdis Delay, NP  Time of first page:  0430  MD notified (2nd page):  Time of second page:  Responding MD:  Merdis Delay, NP   Time MD responded:  [phone]

## 2013-05-10 NOTE — Progress Notes (Signed)
No call back. Will page On call about temperature and pass on to oncoming RN.

## 2013-05-10 NOTE — Progress Notes (Signed)
Patient refused lovenox injection. States it "breaks him out".

## 2013-05-10 NOTE — Care Management (Signed)
This CM received a call from Doristine Mango, Jeremy Mclaughlin's mother regarding outcome of new patient packet. Jeremy Mclaughlin has an appointment on 05/17/13@3pm  to establish care with Dr. [name].    05/11/13: This CM spoke with Jeremy Mclaughlin to advise of appointment scheduled 05/17/13 @3pm  however JeremyMclaughlin will need to continue to have his simple transfusions done at [facility] due to [facility] does not have apheresis equipment. Jeremy Mclaughlin agreed. Jeremy Mclaughlin stated he is feeling better.    Karoline Caldwell, RN, BSN, [place]     [phone]

## 2013-05-10 NOTE — Progress Notes (Signed)
Pt for 1 unit of PRBCs. Temp 100.4. MD paged. Will await callback.

## 2013-05-11 ENCOUNTER — Telehealth (HOSPITAL_COMMUNITY): Payer: Self-pay

## 2013-05-11 LAB — COMPREHENSIVE METABOLIC PANEL
AST: 66 U/L — ABNORMAL HIGH (ref 0–37)
Albumin: 3.6 g/dL (ref 3.5–5.2)
BUN: 4 mg/dL — ABNORMAL LOW (ref 6–23)
Calcium: 8.9 mg/dL (ref 8.4–10.5)
Creatinine, Ser: 0.56 mg/dL (ref 0.50–1.35)
Total Bilirubin: 11.4 mg/dL — ABNORMAL HIGH (ref 0.3–1.2)
Total Protein: 9.6 g/dL — ABNORMAL HIGH (ref 6.0–8.3)

## 2013-05-11 LAB — T-HELPER CELLS (CD4) COUNT (NOT AT ARMC)
CD4 % Helper T Cell: 14 % — ABNORMAL LOW (ref 33–55)
CD4 T Cell Abs: 560 /uL (ref 400–2700)

## 2013-05-11 LAB — CBC
HCT: 19.4 % — ABNORMAL LOW (ref 39.0–52.0)
Hemoglobin: 7.2 g/dL — ABNORMAL LOW (ref 13.0–17.0)
MCH: 31.3 pg (ref 26.0–34.0)
MCHC: 37.1 g/dL — ABNORMAL HIGH (ref 30.0–36.0)
MCV: 84.3 fL (ref 78.0–100.0)
Platelets: 324 K/uL (ref 150–400)
RBC: 2.3 MIL/uL — ABNORMAL LOW (ref 4.22–5.81)
RDW: 20.7 % — ABNORMAL HIGH (ref 11.5–15.5)
WBC: 12.6 K/uL — ABNORMAL HIGH (ref 4.0–10.5)

## 2013-05-11 MED ORDER — SODIUM CHLORIDE 0.45 % IV SOLN
INTRAVENOUS | Status: DC
  Administered 2013-05-11 – 2013-05-12 (×2): via INTRAVENOUS
  Administered 2013-05-13: 75 mL/h via INTRAVENOUS
  Administered 2013-05-13: 1000 mL via INTRAVENOUS
  Administered 2013-05-14: 75 mL/h via INTRAVENOUS

## 2013-05-11 MED ORDER — KETOROLAC TROMETHAMINE 30 MG/ML IJ SOLN
30.0000 mg | Freq: Four times a day (QID) | INTRAMUSCULAR | Status: AC
  Administered 2013-05-11 – 2013-05-16 (×19): 30 mg via INTRAVENOUS
  Filled 2013-05-11 (×23): qty 1

## 2013-05-11 MED ORDER — KETOROLAC TROMETHAMINE 30 MG/ML IJ SOLN: 30.0000 mg | Freq: Four times a day (QID) | INTRAMUSCULAR | Status: DC | PRN

## 2013-05-11 MED ORDER — ONDANSETRON HCL 4 MG/2ML IJ SOLN
4.0000 mg | INTRAMUSCULAR | Status: DC | PRN
  Administered 2013-05-11 – 2013-05-18 (×19): 4 mg via INTRAVENOUS
  Filled 2013-05-11 (×19): qty 2

## 2013-05-11 NOTE — Telephone Encounter (Signed)
This CM left message for CM Marguerite to give a call back, this CM awaiting call

## 2013-05-11 NOTE — Progress Notes (Signed)
[facility] PROGRESS NOTE  Jeremy Mclaughlin WUJ:[id number] DOB: 05/19/1986 DOA: 05/09/2013 PCP: No PCP Per Patient  Brief narrative: Jeremy Mclaughlin is an 27 y.o. male with a PMH of sickle cell/hemoglobin SS disease, HIV (CD4 600 on 02/2013) who was admitted on 05/09/2013 with an acute sickle cell crisis.   Assessment/Plan: Principal Problem: Sickle cell pain crisis -Pain management: Not on routine long-acting opiates at home. -PRN bolus doses of dilaudid to-3 mg Q 2 hours while in acute crisis:  -If patient requires more frequent doses of dilaudid, can switch to PCA. -Toradol IV Q 6 hours for anti-inflammatory effect.  Monitor renal function. -Adjuvant pain therapy: Not on routine adjuvant pain therapy. -Wean IV narcotics when pain rated 7/10.  Today the patient's pain is a level 9,  (when weaning, decrease dose first, then frequency). -When able to tolerate oral therapy, convert at 50-75% of IV dose & give PRNs Q 2-3 hours. -Monitor CBC. -Monitor bilirubin/LDH Q 72 hours to monitor hemolysis.  Current bilirubin: 11.4, LDH 567. -Check reticulocytes if concern for aplastic anemia. -Ferritin level last checked 05/10/2013, and was 691. No current indication for Desferal. -Not on Hydrea, no electrophoresis results in the record.  Will order. May benefit from [facility] if fetal hemoglobin less than 10%. -Continue IVF: 1/2 NS at 23.  D/C IVF when hydrated. Would continue IV fluids given active nausea and vomiting today. -K pad PRN. -Continue folic acid. Active Problems: HIV (human immunodeficiency virus infection) -Not on HAART. CD4 count 560. Chronic Anemia -Patient's baseline hemoglobin is: 7.5-8.5. -Patient's transfusion hemoglobin threshold is: Less than 7 mg/dL. -Status post 1 unit of packed red blood cells 05/10/2013. Hemoglobin today is 7.2 mg/dL. Leukocytosis, unspecified -Likely secondary to chronic inflammation.  Code Status: Full. Family Communication: No family at the  bedside. Disposition Plan: Home when stable.   Medical Consultants:  None.  Other Consultants:  None.  Anti-infectives:  None.  HPI/Subjective: Jeremy Mclaughlin continues to have level 9/10 sickle cell pain. Had active nausea and vomiting this morning. No shortness of breath or cough. Bowels are moving.  Objective: Filed Vitals:   05/10/13 2256 05/11/13 0010 05/11/13 0144 05/11/13 0545  BP:  128/80 135/68 126/82  Pulse: 80 71 78 62  Temp:  98 F (36.7 C) 98.3 F (36.8 C) 97.9 F (36.6 C)  TempSrc:  Oral Oral Oral  Resp:  18 18 14   Height:      Weight:      SpO2: 94% 99% 100% 100%    Intake/Output Summary (Last 24 hours) at 05/11/13 1034 Last data filed at 05/11/13 0545  Gross per 24 hour  Intake   2335 ml  Output   2225 ml  Net    110 ml    Exam: Gen:  NAD Cardiovascular:  RRR, No M/R/G Respiratory:  Lungs CTAB Gastrointestinal:  Abdomen soft, NT/ND, + BS Extremities:  No C/E/C  Data Reviewed: Basic Metabolic Panel:  Recent Labs Lab 05/09/13 1235 05/10/13 0403 05/11/13 0225  NA 134* 132* 133*  K 4.4 3.5 3.9  CL 102 101 100  CO2 28 29 28   GLUCOSE 86 96 97  BUN <3* 4* 4*  CREATININE 0.55 0.58 0.56  CALCIUM 8.9 8.3* 8.9   GFR Estimated Creatinine Clearance: 122.8 ml/min (by C-G formula based on Cr of 0.56). Liver Function Tests:  Recent Labs Lab 05/09/13 1235 05/10/13 0403 05/11/13 0225  AST 67* 57* 66*  ALT 30 27 28   ALKPHOS 69 68 79  BILITOT 9.1* 8.1* 11.4*  PROT 10.0* 8.8* 9.6*  ALBUMIN 3.6 3.2* 3.6    CBC:  Recent Labs Lab 05/09/13 1235 05/10/13 0403 05/11/13 0225  WBC 13.2* 13.7* 12.6*  NEUTROABS 7.8*  --   --   HGB 6.8* 5.4* 7.2*  HCT 19.1* 14.8* 19.4*  MCV 89.7 88.1 84.3  PLT 353 254 324   Anemia work up  Recent Labs  05/09/13 1235 05/10/13 1300  FERRITIN  --  691*  RETICCTPCT 27.6*  --    Microbiology No results found for this or any previous visit (from the past 240 hour(s)).   Procedures and Diagnostic  Studies: No results found.  Scheduled Meds: . aspirin EC  81 mg Oral Daily  . folic acid  1 mg Oral Daily   Continuous Infusions: . sodium chloride 75 mL/hr at 05/11/13 0010    Time spent: 25 minutes.   LOS: 2 days   RAMA,CHRISTINA  [facility] Pager [phone].   *Please note that the hospitalists switch teams on Wednesdays. Please call the flow manager at [phone] if you are having difficulty reaching the hospitalist taking care of this patient as she can update you and provide the most up-to-date pager number of provider caring for the patient. If 8PM-8AM, please contact night-coverage at [email or web address], password [facility]  05/11/2013, 10:34 AM

## 2013-05-11 NOTE — Progress Notes (Signed)
Paged NP about low grade fever and persistent nausea. Orders received for advil and IV phenergan x1 dose each.

## 2013-05-11 NOTE — Plan of Care (Signed)
Problem: Phase II Progression Outcomes Goal: Tolerating diet Outcome: Not Met (add Reason) Patient with decreased po intake. Nausea persistent even with prns.

## 2013-05-12 LAB — COMPREHENSIVE METABOLIC PANEL
ALT: 29 U/L (ref 0–53)
AST: 72 U/L — ABNORMAL HIGH (ref 0–37)
CO2: 29 mEq/L (ref 19–32)
Chloride: 100 mEq/L (ref 96–112)
Creatinine, Ser: 0.52 mg/dL (ref 0.50–1.35)
GFR calc Af Amer: >90 mL/min (ref >90)
GFR calc non Af Amer: >90 mL/min (ref >90)
Glucose, Bld: 104 mg/dL — ABNORMAL HIGH (ref 70–99)
Total Bilirubin: 11.8 mg/dL — ABNORMAL HIGH (ref 0.3–1.2)

## 2013-05-12 LAB — CBC
HCT: 17.5 % — ABNORMAL LOW (ref 39.0–52.0)
Hemoglobin: 6.7 g/dL — CL (ref 13.0–17.0)
MCV: 83.3 fL (ref 78.0–100.0)
Platelets: 285 10*3/uL (ref 150–400)
RBC: 2.1 MIL/uL — ABNORMAL LOW (ref 4.22–5.81)
WBC: 13.6 10*3/uL — ABNORMAL HIGH (ref 4.0–10.5)

## 2013-05-12 LAB — PREPARE RBC (CROSSMATCH)

## 2013-05-12 MED ORDER — POTASSIUM CHLORIDE CRYS ER 20 MEQ PO TBCR
20.0000 meq | EXTENDED_RELEASE_TABLET | Freq: Three times a day (TID) | ORAL | Status: AC
  Administered 2013-05-12 (×3): 20 meq via ORAL
  Filled 2013-05-12 (×3): qty 1

## 2013-05-12 NOTE — Progress Notes (Signed)
CRITICAL VALUE ALERT  Critical value received: HGB  6.7  Date of notification:  8/28  Time of notification:  0400  Critical value read back:yes  Nurse who received alert:  Jilda Panda, RN  MD notified (1st page):  Merdis Delay, NP  Time of first page:  0430  MD notified (2nd page):  Time of second page:  Responding MD:  S.Schorr,NP  Time MD responded:  [phone]

## 2013-05-12 NOTE — Progress Notes (Signed)
[facility] PROGRESS NOTE  Jeremy Mclaughlin YNW:[id number] DOB: 12-02-1985 DOA: 05/09/2013 PCP: No PCP Per Patient  Brief narrative: Jeremy Mclaughlin is an 27 y.o. male with a PMH of sickle cell/hemoglobin SS disease, HIV (CD4 600 on 02/2013) who was admitted on 05/09/2013 with an acute sickle cell crisis.   Assessment/Plan: Principal Problem: Sickle cell pain crisis -Pain management: Not on routine long-acting opiates at home. -PRN bolus doses of dilaudid to-3 mg Q 2 hours while in acute crisis:  -If patient requires more frequent doses of dilaudid, can switch to PCA. -Toradol IV Q 6 hours for anti-inflammatory effect.  Monitor renal function. -Adjuvant pain therapy: Not on routine adjuvant pain therapy. -Wean IV narcotics when pain rated 7/10.  Today the patient's pain is a level 9,  (when weaning, decrease dose first, then frequency). -When able to tolerate oral therapy, convert at 50-75% of IV dose & give PRNs Q 2-3 hours. -Monitor CBC. -Monitor bilirubin/LDH Q 72 hours to monitor hemolysis.  Current bilirubin: 11.4, LDH 567. -Check reticulocytes if concern for aplastic anemia. -Ferritin level last checked 05/10/2013, and was 691. No current indication for Desferal. -Not on Hydrea, no electrophoresis results in the record.  Ordered, followup results. May benefit from [facility] if fetal hemoglobin less than 10%. -Continue IVF: 1/2 NS at 74.  D/C IVF when hydrated. Would continue IV fluids given active nausea and vomiting today. -K pad PRN. -Continue folic acid. Active Problems: Hypokalemia -We'll replete. HIV (human immunodeficiency virus infection) -Not on HAART. CD4 count 560. Chronic Anemia -Patient's baseline hemoglobin is: 7.5-8.5. -Patient's transfusion hemoglobin threshold is: Less than 7 mg/dL. -Status post 1 unit of packed red blood cells 05/10/2013. Hemoglobin today is 6.7 mg/dL. We'll give an additional unit of packed red blood cells today. Leukocytosis, unspecified -Likely  secondary to chronic inflammation.  Code Status: Full. Family Communication: No family at the bedside. Disposition Plan: Home when stable.   Medical Consultants:  None.  Other Consultants:  None.  Anti-infectives:  None.  HPI/Subjective: [name] continues to have level 9/10 sickle cell pain, no improvement yet. Continues to have some nausea. No shortness of breath or cough. Bowels are moving.  Objective: Filed Vitals:   05/11/13 1328 05/11/13 1733 05/12/13 0301 05/12/13 0446  BP: 132/76 137/84 132/81 138/83  Pulse: 86 75 91 82  Temp: 99 F (37.2 C) 98.7 F (37.1 C) 98.4 F (36.9 C) 98.4 F (36.9 C)  TempSrc: Oral Oral Oral Oral  Resp: 16 16 16 16   Height:      Weight:    66.588 kg (146 lb 12.8 oz)  SpO2: 99% 100% 98% 100%    Intake/Output Summary (Last 24 hours) at 05/12/13 0719 Last data filed at 05/12/13 0600  Gross per 24 hour  Intake   2242 ml  Output   1550 ml  Net    692 ml    Exam: Gen:  NAD Cardiovascular:  RRR, No M/R/G Respiratory:  Lungs CTAB Gastrointestinal:  Abdomen soft, NT/ND, + BS Extremities:  No C/E/C  Data Reviewed: Basic Metabolic Panel:  Recent Labs Lab 05/09/13 1235 05/10/13 0403 05/11/13 0225 05/12/13 0325  NA 134* 132* 133* 135  K 4.4 3.5 3.9 3.3*  CL 102 101 100 100  CO2 28 29 28 29   GLUCOSE 86 96 97 104*  BUN <3* 4* 4* 4*  CREATININE 0.55 0.58 0.56 0.52  CALCIUM 8.9 8.3* 8.9 8.7   GFR Estimated Creatinine Clearance: 122.8 ml/min (by C-G formula based on Cr of 0.52).  Liver Function Tests:  Recent Labs Lab 05/09/13 1235 05/10/13 0403 05/11/13 0225 05/12/13 0325  AST 67* 57* 66* 72*  ALT 30 27 28 29   ALKPHOS 69 68 79 79  BILITOT 9.1* 8.1* 11.4* 11.8*  PROT 10.0* 8.8* 9.6* 9.0*  ALBUMIN 3.6 3.2* 3.6 3.3*    CBC:  Recent Labs Lab 05/09/13 1235 05/10/13 0403 05/11/13 0225 05/12/13 0325  WBC 13.2* 13.7* 12.6* 13.6*  NEUTROABS 7.8*  --   --   --   HGB 6.8* 5.4* 7.2* 6.7*  HCT 19.1* 14.8*  19.4* 17.5*  MCV 89.7 88.1 84.3 83.3  PLT 353 254 324 285   Anemia work up  Recent Labs  05/09/13 1235 05/10/13 1300  FERRITIN  --  691*  RETICCTPCT 27.6*  --    Microbiology No results found for this or any previous visit (from the past 240 hour(s)).   Procedures and Diagnostic Studies: No results found.  Scheduled Meds: . aspirin EC  81 mg Oral Daily  . folic acid  1 mg Oral Daily  . ketorolac  30 mg Intravenous Q6H   Continuous Infusions: . sodium chloride 75 mL/hr at 05/12/13 0058    Time spent: 25 minutes.   LOS: 3 days   Kiaira Pointer  [facility] Pager [phone].   *Please note that the hospitalists switch teams on Wednesdays. Please call the flow manager at [phone] if you are having difficulty reaching the hospitalist taking care of this patient as she can update you and provide the most up-to-date pager number of provider caring for the patient. If 8PM-8AM, please contact night-coverage at [email or web address], password [facility]  05/12/2013, 7:19 AM

## 2013-05-12 NOTE — Care Management Note (Signed)
   CARE MANAGEMENT NOTE 05/12/2013  Patient:  Jeremy Mclaughlin, Jeremy Mclaughlin   Account Number:  [id number]  Date Initiated:  05/12/2013  Documentation initiated by:  Lanayah Gartley  Subjective/Objective Assessment:   27 yo male admitted with sickle cell crisis. Rn Case Manager at [facility] following for dc follow-up.     Action/Plan:   Home when stable   Anticipated DC Date:     Anticipated DC Plan:  HOME/SELF CARE      DC Planning Services  CM consult      Choice offered to / List presented to:  NA   DME arranged  NA      DME agency  NA     HH arranged  NA      HH agency  NA   Status of service:  Completed, signed off Medicare Important Message given?   (If response is "NO", the following Medicare IM given date fields will be blank) Date Medicare IM given:   Date Additional Medicare IM given:    Discharge Disposition:    Per UR Regulation:  Reviewed for med. necessity/level of care/duration of stay  If discussed at Long Length of Stay Meetings, dates discussed:    Comments:  05/12/13 1451 Anneke Cundy,RN,BSN [phone] Chart reviewed for utilization of services. No PCP on record. Pt to follow up with [facility] upon discharge to establish care with Dr. Marthann Schiller.05/17/13@3pm

## 2013-05-13 ENCOUNTER — Telehealth (HOSPITAL_COMMUNITY): Payer: Self-pay

## 2013-05-13 LAB — TYPE AND SCREEN
ABO/RH(D): A POS
Antibody Screen: NEGATIVE
Unit division: 0
Unit division: 0

## 2013-05-13 LAB — COMPREHENSIVE METABOLIC PANEL
ALT: 37 U/L (ref 0–53)
AST: 81 U/L — ABNORMAL HIGH (ref 0–37)
Albumin: 3.2 g/dL — ABNORMAL LOW (ref 3.5–5.2)
CO2: 31 mEq/L (ref 19–32)
Calcium: 8.9 mg/dL (ref 8.4–10.5)
Chloride: 101 mEq/L (ref 96–112)
GFR calc non Af Amer: >90 mL/min (ref >90)
Sodium: 135 mEq/L (ref 135–145)

## 2013-05-13 LAB — CBC
MCH: 30 pg (ref 26.0–34.0)
Platelets: 279 10*3/uL (ref 150–400)
RBC: 2.43 MIL/uL — ABNORMAL LOW (ref 4.22–5.81)
WBC: 9.9 10*3/uL (ref 4.0–10.5)

## 2013-05-13 MED ORDER — SALINE SPRAY 0.65 % NA SOLN
1.0000 | NASAL | Status: DC | PRN
  Filled 2013-05-13: qty 44

## 2013-05-13 NOTE — Progress Notes (Signed)
[facility] PROGRESS NOTE  Rushi Chasen ZOX:[id number] DOB: [birth year] DOA: 05/09/2013 PCP: No PCP Per Patient  Brief narrative: Jeremy Mclaughlin is an 27 y.o. male with a PMH of sickle cell/hemoglobin SS disease, HIV (CD4 600 on 02/2013) who was admitted on 05/09/2013 with an acute sickle cell crisis.   Assessment/Plan: Principal Problem: Sickle cell pain crisis -Pain management: Not on routine long-acting opiates at home. -PRN bolus doses of dilaudid to-3 mg Q 2 hours while in acute crisis:  -If patient requires more frequent doses of dilaudid, can switch to PCA. -Toradol IV Q 6 hours for anti-inflammatory effect.  Monitor renal function. -Adjuvant pain therapy: Not on routine adjuvant pain therapy. -Wean IV narcotics when pain rated 7/10.  Today the patient's pain is a level 10,  (when weaning, decrease dose first, then frequency). -When able to tolerate oral therapy, convert at 50-75% of IV dose & give PRNs Q 2-3 hours. -Monitor CBC. -Monitor bilirubin/LDH Q 72 hours to monitor hemolysis.  Current bilirubin: 11.4, LDH 567. -Check reticulocytes if concern for aplastic anemia. -Ferritin level last checked 05/10/2013, and was 691. No current indication for Desferal. -Not on Hydrea, no electrophoresis results in the record.  Ordered, followup results. May benefit from [facility] if fetal hemoglobin less than 10%. -Continue IVF: 1/2 NS at 56.  D/C IVF when hydrated. Would continue IV fluids given active nausea and vomiting today. -K pad PRN. -Continue folic acid. Active Problems: Hypokalemia -Repleted. HIV (human immunodeficiency virus infection) -Not on HAART. CD4 count 560. Chronic Anemia -Patient's baseline hemoglobin is: 7.5-8.5. -Patient's transfusion hemoglobin threshold is: Less than 7 mg/dL. -Status post a total of 2 units of packed red blood cells. Hemoglobin today is 7.3 mg/dL.  Leukocytosis, unspecified -Likely secondary to chronic inflammation.  Code Status: Full. Family  Communication: No family at the bedside. Disposition Plan: Home when stable.   Medical Consultants:  None.  Other Consultants:  None.  Anti-infectives:  None.  HPI/Subjective: Jeremy Mclaughlin continues to have level 10/10 sickle cell pain, eases off after being given pain medications, but comes back at a level X. Continues to have some nausea and vomiting with decreased appetite. No shortness of breath or cough. Bowels are moving. Complains of nasal dryness.  Objective: Filed Vitals:   05/12/13 2257 05/12/13 2330 05/13/13 0100 05/13/13 0500  BP: 151/91 143/87 123/77 128/79  Pulse: 77 85 85 78  Temp: 98.5 F (36.9 C) 98.6 F (37 C) 99.5 F (37.5 C) 98.3 F (36.8 C)  TempSrc: Oral Oral Oral Oral  Resp: 16 16 16 16   Height:      Weight:      SpO2:  100% 93% 93%    Intake/Output Summary (Last 24 hours) at 05/13/13 0729 Last data filed at 05/13/13 0400  Gross per 24 hour  Intake 1190.5 ml  Output   2950 ml  Net -1759.5 ml    Exam: Gen:  NAD Cardiovascular:  RRR, No M/R/G Respiratory:  Lungs CTAB Gastrointestinal:  Abdomen soft, NT/ND, + BS Extremities:  No C/E/C  Data Reviewed: Basic Metabolic Panel:  Recent Labs Lab 05/09/13 1235 05/10/13 0403 05/11/13 0225 05/12/13 0325 05/13/13 0408  NA 134* 132* 133* 135 135  K 4.4 3.5 3.9 3.3* 3.5  CL 102 101 100 100 101  CO2 28 29 28 29 31   GLUCOSE 86 96 97 104* 96  BUN <3* 4* 4* 4* 3*  CREATININE 0.55 0.58 0.56 0.52 0.49*  CALCIUM 8.9 8.3* 8.9 8.7 8.9   GFR Estimated Creatinine  Clearance: 122.8 ml/min (by C-G formula based on Cr of 0.49). Liver Function Tests:  Recent Labs Lab 05/09/13 1235 05/10/13 0403 05/11/13 0225 05/12/13 0325 05/13/13 0408  AST 67* 57* 66* 72* 81*  ALT 30 27 28 29  37  ALKPHOS 69 68 79 79 87  BILITOT 9.1* 8.1* 11.4* 11.8* 11.4*  PROT 10.0* 8.8* 9.6* 9.0* 9.0*  ALBUMIN 3.6 3.2* 3.6 3.3* 3.2*    CBC:  Recent Labs Lab 05/09/13 1235 05/10/13 0403 05/11/13 0225  05/12/13 0325 05/13/13 0408  WBC 13.2* 13.7* 12.6* 13.6* 9.9  NEUTROABS 7.8*  --   --   --   --   HGB 6.8* 5.4* 7.2* 6.7* 7.3*  HCT 19.1* 14.8* 19.4* 17.5* 20.4*  MCV 89.7 88.1 84.3 83.3 84.0  PLT 353 254 324 285 279   Anemia work up  Recent Labs  05/10/13 1300  FERRITIN 691*   Microbiology No results found for this or any previous visit (from the past 240 hour(s)).   Procedures and Diagnostic Studies: No results found.  Scheduled Meds: . aspirin EC  81 mg Oral Daily  . folic acid  1 mg Oral Daily  . ketorolac  30 mg Intravenous Q6H   Continuous Infusions: . sodium chloride 75 mL/hr (05/13/13 0008)    Time spent: 25 minutes.   LOS: 4 days   RAMA,CHRISTINA  [facility] Pager [phone].   *Please note that the hospitalists switch teams on Wednesdays. Please call the flow manager at [phone] if you are having difficulty reaching the hospitalist taking care of this patient as she can update you and provide the most up-to-date pager number of provider caring for the patient. If 8PM-8AM, please contact night-coverage at [email or web address], password [facility]  05/13/2013, 7:29 AM

## 2013-05-13 NOTE — Telephone Encounter (Signed)
This CM called to advise of [name] with Dr. Cornelius Moras office at [facility] working to get Mr. Jeremy Mclaughlin in earlier for apheresis. Left message for call back.

## 2013-05-13 NOTE — ED Provider Notes (Signed)
Medical screening examination/treatment/procedure(s) were performed by non-physician practitioner and as supervising physician I was immediately available for consultation/collaboration.    Gilda Crease, MD 05/13/13 [phone]

## 2013-05-13 NOTE — Telephone Encounter (Signed)
This CM spoke with patient and mother regarding seeing Dr. Barry Dienes seeing patient at [facility] for a simple transfusion.   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 Selena Batten states she will work to get him in earlier.     Karoline Caldwell, RN, BSN, [place]     [phone]

## 2013-05-14 LAB — COMPREHENSIVE METABOLIC PANEL
ALT: 36 U/L (ref 0–53)
BUN: 5 mg/dL — ABNORMAL LOW (ref 6–23)
Calcium: 9.1 mg/dL (ref 8.4–10.5)
Creatinine, Ser: 0.5 mg/dL (ref 0.50–1.35)
GFR calc Af Amer: >90 mL/min (ref >90)
GFR calc non Af Amer: >90 mL/min (ref >90)
Glucose, Bld: 82 mg/dL (ref 70–99)
Sodium: 133 mEq/L — ABNORMAL LOW (ref 135–145)
Total Protein: 9.2 g/dL — ABNORMAL HIGH (ref 6.0–8.3)

## 2013-05-14 LAB — CBC
Hemoglobin: 7.8 g/dL — ABNORMAL LOW (ref 13.0–17.0)
MCH: 30.4 pg (ref 26.0–34.0)
MCHC: 36.1 g/dL — ABNORMAL HIGH (ref 30.0–36.0)
MCV: 84 fL (ref 78.0–100.0)

## 2013-05-14 MED ORDER — SODIUM CHLORIDE 0.45 % IV SOLN
INTRAVENOUS | Status: DC
  Administered 2013-05-14 – 2013-05-17 (×4): via INTRAVENOUS
  Filled 2013-05-14 (×9): qty 1000

## 2013-05-14 NOTE — Progress Notes (Signed)
[facility] PROGRESS NOTE  Jeremy Mclaughlin ZOX:[id number] DOB: 06-25-1986 DOA: 05/09/2013 PCP: No PCP Per Patient  Brief narrative: Jeremy Mclaughlin is an 27 y.o. male with a PMH of sickle cell/hemoglobin SS disease, HIV (CD4 600 on 02/2013) who was admitted on 05/09/2013 with an acute sickle cell crisis.   Assessment/Plan: Principal Problem: Sickle cell pain crisis -Pain management: Not on routine long-acting opiates at home. -PRN bolus doses of dilaudid to-3 mg Q 2 hours while in acute crisis:  -Toradol IV Q 6 hours for anti-inflammatory effect.  Monitor renal function. -Wean IV narcotics when pain rated 7/10.  Today the patient's pain is a level 10,  (when weaning, decrease dose first, then frequency). -When able to tolerate oral therapy, convert at 50-75% of IV dose & give PRNs Q 2-3 hours. -Monitor CBC. -Monitor bilirubin/LDH.  Current bilirubin: 12.8, LDH 567. -Ferritin level last checked 05/10/2013, and was 691. No current indication for Desferal. -Not on Hydrea, no electrophoresis results in the record.  Ordered, followup results. May benefit from [facility] if fetal hemoglobin less than 10%. -Continue IVF: 1/2 NS at 75.    -K pad PRN. -Continue folic acid. Active Problems: Hypokalemia -Add potassium to IV fluids. HIV (human immunodeficiency virus infection) -Not on HAART. CD4 count 560. Chronic Anemia -Patient's baseline hemoglobin is: 7.5-8.5. -Patient's transfusion hemoglobin threshold is: Less than 7 mg/dL. -Status post a total of 2 units of packed red blood cells. Hemoglobin today is 7.8 mg/dL.  Leukocytosis, unspecified -Likely secondary to chronic inflammation.  Code Status: Full. Family Communication: No family at the bedside. Disposition Plan: Home when stable.   Medical Consultants:  None.  Other Consultants:  None.  Anti-infectives:  None.  HPI/Subjective: Jeremy Mclaughlin continues to have level 10/10 sickle cell pain, eases off after being given pain  medications, but overall, has less need for frequent doses of pain medication. Nausea and vomiting have improved. No shortness of breath or cough. Bowels are moving.  Objective: Filed Vitals:   05/13/13 1847 05/13/13 2333 05/14/13 0215 05/14/13 0710  BP: 134/56 144/87 143/91 138/78  Pulse: 72 86 81 83  Temp: 98.2 F (36.8 C) 98.2 F (36.8 C) 97.6 F (36.4 C) 98.6 F (37 C)  TempSrc: Oral Oral Oral Oral  Resp: 16 20 20 16   Height:      Weight:      SpO2: 95% 93% 93% 94%    Intake/Output Summary (Last 24 hours) at 05/14/13 0754 Last data filed at 05/14/13 0030  Gross per 24 hour  Intake    240 ml  Output   1600 ml  Net  -1360 ml    Exam: Gen:  NAD Cardiovascular:  RRR, No M/R/G Respiratory:  Lungs CTAB Gastrointestinal:  Abdomen soft, NT/ND, + BS Extremities:  No C/E/C  Data Reviewed: Basic Metabolic Panel:  Recent Labs Lab 05/10/13 0403 05/11/13 0225 05/12/13 0325 05/13/13 0408 05/14/13 0400  NA 132* 133* 135 135 133*  K 3.5 3.9 3.3* 3.5 3.4*  CL 101 100 100 101 98  CO2 29 28 29 31 29   GLUCOSE 96 97 104* 96 82  BUN 4* 4* 4* 3* 5*  CREATININE 0.58 0.56 0.52 0.49* 0.50  CALCIUM 8.3* 8.9 8.7 8.9 9.1   GFR Estimated Creatinine Clearance: 122.8 ml/min (by C-G formula based on Cr of 0.5). Liver Function Tests:  Recent Labs Lab 05/10/13 0403 05/11/13 0225 05/12/13 0325 05/13/13 0408 05/14/13 0400  AST 57* 66* 72* 81* 76*  ALT 27 28 29  37 36  ALKPHOS 68 79 79 87 93  BILITOT 8.1* 11.4* 11.8* 11.4* 12.8*  PROT 8.8* 9.6* 9.0* 9.0* 9.2*  ALBUMIN 3.2* 3.6 3.3* 3.2* 3.2*    CBC:  Recent Labs Lab 05/09/13 1235 05/10/13 0403 05/11/13 0225 05/12/13 0325 05/13/13 0408 05/14/13 0400  WBC 13.2* 13.7* 12.6* 13.6* 9.9 9.5  NEUTROABS 7.8*  --   --   --   --   --   HGB 6.8* 5.4* 7.2* 6.7* 7.3* 7.8*  HCT 19.1* 14.8* 19.4* 17.5* 20.4* 21.6*  MCV 89.7 88.1 84.3 83.3 84.0 84.0  PLT 353 254 324 285 279 280   Microbiology No results found for this or any  previous visit (from the past 240 hour(s)).   Procedures and Diagnostic Studies: No results found.  Scheduled Meds: . aspirin EC  81 mg Oral Daily  . folic acid  1 mg Oral Daily  . ketorolac  30 mg Intravenous Q6H   Continuous Infusions: . sodium chloride 75 mL/hr (05/14/13 0327)    Time spent: 25 minutes.   LOS: 5 days   RAMA,CHRISTINA  [facility] Pager [phone].   *Please note that the hospitalists switch teams on Wednesdays. Please call the flow manager at [phone] if you are having difficulty reaching the hospitalist taking care of this patient as she can update you and provide the most up-to-date pager number of provider caring for the patient. If 8PM-8AM, please contact night-coverage at [email or web address], password [facility]  05/14/2013, 7:54 AM

## 2013-05-15 DIAGNOSIS — H6120 Impacted cerumen, unspecified ear: Secondary | ICD-10-CM | POA: Diagnosis present

## 2013-05-15 LAB — COMPREHENSIVE METABOLIC PANEL
ALT: 32 U/L (ref 0–53)
Alkaline Phosphatase: 89 U/L (ref 39–117)
BUN: 8 mg/dL (ref 6–23)
CO2: 29 mEq/L (ref 19–32)
Chloride: 100 mEq/L (ref 96–112)
GFR calc Af Amer: >90 mL/min (ref >90)
Glucose, Bld: 97 mg/dL (ref 70–99)
Potassium: 4.5 mEq/L (ref 3.5–5.1)
Sodium: 133 mEq/L — ABNORMAL LOW (ref 135–145)
Total Bilirubin: 11.7 mg/dL — ABNORMAL HIGH (ref 0.3–1.2)

## 2013-05-15 LAB — CBC
HCT: 21.3 % — ABNORMAL LOW (ref 39.0–52.0)
Hemoglobin: 7.5 g/dL — ABNORMAL LOW (ref 13.0–17.0)
RBC: 2.5 MIL/uL — ABNORMAL LOW (ref 4.22–5.81)
WBC: 9.4 10*3/uL (ref 4.0–10.5)

## 2013-05-15 MED ORDER — CARBAMIDE PEROXIDE 6.5 % OT SOLN
5.0000 [drp] | Freq: Two times a day (BID) | OTIC | Status: DC
  Administered 2013-05-15 – 2013-05-18 (×7): 5 [drp] via OTIC
  Filled 2013-05-15: qty 15

## 2013-05-15 NOTE — Progress Notes (Signed)
[facility] PROGRESS NOTE  Jeremy Mclaughlin WUJ:[id number] DOB: [birth year] DOA: 05/09/2013 PCP: No PCP Per Patient  Brief narrative: Jeremy Mclaughlin is an 27 y.o. male with a PMH of sickle cell/hemoglobin SS disease, HIV (CD4 600 on 02/2013) who was admitted on 05/09/2013 with an acute sickle cell crisis.   Assessment/Plan: Principal Problem: Sickle cell pain crisis -Pain management: Not on routine long-acting opiates at home. -PRN bolus doses of dilaudid 2-3 mg Q 2 hours while in acute crisis:  -Toradol IV Q 6 hours for anti-inflammatory effect.  Monitor renal function. -Wean IV narcotics when pain rated 7/10.  Today the patient's pain is a level 10,  (when weaning, decrease dose first, then frequency). -When able to tolerate oral therapy, convert at 50-75% of IV dose & give PRNs Q 2-3 hours. -Monitor CBC. -Monitor bilirubin/LDH.  Current bilirubin: 11.7, LDH 567. -Ferritin level last checked 05/10/2013, and was 691. No current indication for Desferal. -Not on Hydrea, no electrophoresis results in the record.  Ordered, followup results. May benefit from [facility] if fetal hemoglobin less than 10%. -Continue IVF: 1/2 NS at 75.  -K pad PRN. -Continue folic acid. Active Problems: Cerumen impaction -Irrigate ear canals daily per nursing staff. Debrox twice a day. Hypokalemia -Resolved with IV fluids containing potassium. HIV (human immunodeficiency virus infection) -Not on HAART. CD4 count 560. Chronic Anemia -Patient's baseline hemoglobin is: 7.5-8.5. -Patient's transfusion hemoglobin threshold is: Less than 7 mg/dL. -Status post a total of 2 units of packed red blood cells. Hemoglobin today is 7.5 mg/dL.  Leukocytosis, unspecified -Resolved.  Code Status: Full. Family Communication: No family at the bedside. Disposition Plan: Home when stable.   Medical Consultants:  None.  Other Consultants:  None.  Anti-infectives:  None.  HPI/Subjective: Jeremy Mclaughlin continues to  have 10/10 sickle cell pain needing IV opiates for relief. Denies nausea, vomiting or constipation. Complains of feeling like his left ear is blocked.  Objective: Filed Vitals:   05/14/13 1505 05/14/13 2220 05/15/13 0221 05/15/13 0710  BP: 136/72 136/86 127/71 136/84  Pulse: 80 79 71 78  Temp: 98.1 F (36.7 C) 99.1 F (37.3 C) 98.8 F (37.1 C) 98.4 F (36.9 C)  TempSrc: Oral Oral Oral Oral  Resp: 18 20 20 20   Height:      Weight:    61.598 kg (135 lb 12.8 oz)  SpO2: 96% 91% 100% 100%    Intake/Output Summary (Last 24 hours) at 05/15/13 0753 Last data filed at 05/15/13 9562  Gross per 24 hour  Intake 2383.75 ml  Output   1950 ml  Net 433.75 ml    Exam: Gen:  NAD Ears: Bilateral cerumen impaction, worse on left Cardiovascular:  RRR, No M/R/G Respiratory:  Lungs CTAB Gastrointestinal:  Abdomen soft, NT/ND, + BS Extremities:  No C/E/C  Data Reviewed: Basic Metabolic Panel:  Recent Labs Lab 05/11/13 0225 05/12/13 0325 05/13/13 0408 05/14/13 0400 05/15/13 0448  NA 133* 135 135 133* 133*  K 3.9 3.3* 3.5 3.4* 4.5  CL 100 100 101 98 100  CO2 28 29 31 29 29   GLUCOSE 97 104* 96 82 97  BUN 4* 4* 3* 5* 8  CREATININE 0.56 0.52 0.49* 0.50 0.62  CALCIUM 8.9 8.7 8.9 9.1 8.9   GFR Estimated Creatinine Clearance: 122.8 ml/min (by C-G formula based on Cr of 0.62). Liver Function Tests:  Recent Labs Lab 05/11/13 0225 05/12/13 0325 05/13/13 0408 05/14/13 0400 05/15/13 0448  AST 66* 72* 81* 76* 77*  ALT 28 29 37 36  32  ALKPHOS 79 79 87 93 89  BILITOT 11.4* 11.8* 11.4* 12.8* 11.7*  PROT 9.6* 9.0* 9.0* 9.2* 9.5*  ALBUMIN 3.6 3.3* 3.2* 3.2* 3.3*    CBC:  Recent Labs Lab 05/09/13 1235  05/11/13 0225 05/12/13 0325 05/13/13 0408 05/14/13 0400 05/15/13 0448  WBC 13.2*  < > 12.6* 13.6* 9.9 9.5 9.4  NEUTROABS 7.8*  --   --   --   --   --   --   HGB 6.8*  < > 7.2* 6.7* 7.3* 7.8* 7.5*  HCT 19.1*  < > 19.4* 17.5* 20.4* 21.6* 21.3*  MCV 89.7  < > 84.3 83.3 84.0 84.0  85.2  PLT 353  < > 324 285 279 280 294  < > = values in this interval not displayed. Microbiology No results found for this or any previous visit (from the past 240 hour(s)).   Procedures and Diagnostic Studies: No results found.  Scheduled Meds: . aspirin EC  81 mg Oral Daily  . folic acid  1 mg Oral Daily  . ketorolac  30 mg Intravenous Q6H   Continuous Infusions: . sodium chloride 0.45 % with kcl 75 mL/hr at 05/15/13 0124    Time spent: 25 minutes.   LOS: 6 days   RAMA,CHRISTINA  [facility] Pager [phone].   *Please note that the hospitalists switch teams on Wednesdays. Please call the flow manager at [phone] if you are having difficulty reaching the hospitalist taking care of this patient as she can update you and provide the most up-to-date pager number of provider caring for the patient. If 8PM-8AM, please contact night-coverage at [email or web address], password [facility]  05/15/2013, 7:53 AM

## 2013-05-16 LAB — CBC
HCT: 19.9 % — ABNORMAL LOW (ref 39.0–52.0)
MCHC: 35.2 g/dL (ref 30.0–36.0)
Platelets: 295 10*3/uL (ref 150–400)
RDW: 20.8 % — ABNORMAL HIGH (ref 11.5–15.5)
WBC: 11.3 10*3/uL — ABNORMAL HIGH (ref 4.0–10.5)

## 2013-05-16 LAB — COMPREHENSIVE METABOLIC PANEL
AST: 68 U/L — ABNORMAL HIGH (ref 0–37)
Albumin: 3.4 g/dL — ABNORMAL LOW (ref 3.5–5.2)
BUN: 5 mg/dL — ABNORMAL LOW (ref 6–23)
Chloride: 98 mEq/L (ref 96–112)
Creatinine, Ser: 0.54 mg/dL (ref 0.50–1.35)
Potassium: 3.6 mEq/L (ref 3.5–5.1)
Total Bilirubin: 10.5 mg/dL — ABNORMAL HIGH (ref 0.3–1.2)
Total Protein: 9.4 g/dL — ABNORMAL HIGH (ref 6.0–8.3)

## 2013-05-16 MED ORDER — HYDROMORPHONE HCL PF 2 MG/ML IJ SOLN
2.0000 mg | INTRAMUSCULAR | Status: DC | PRN
  Administered 2013-05-16 – 2013-05-18 (×20): 2 mg via INTRAVENOUS
  Filled 2013-05-16 (×20): qty 1

## 2013-05-16 NOTE — Progress Notes (Signed)
[facility] PROGRESS NOTE  Takeem Krotzer WUJ:[id number] DOB: 11/08/1985 DOA: 05/09/2013 PCP: No PCP Per Patient  Brief narrative: Jeremy Mclaughlin is an 27 y.o. male with a PMH of sickle cell/hemoglobin SS disease, HIV (CD4 600 on 02/2013) who was admitted on 05/09/2013 with an acute sickle cell crisis.   Assessment/Plan: Principal Problem: Sickle cell pain crisis -Pain management: Not on routine long-acting opiates at home. -Decrease PRN bolus doses of dilaudid to 2mg  Q 2 hours while in acute crisis.   -Toradol IV Q 6 hours for anti-inflammatory effect.  Monitor renal function. -Wean IV narcotics when pain rated 7/10.  Today the patient's pain is a level 10,  (when weaning, decrease dose first, then frequency). -When able to tolerate oral therapy, convert at 50-75% of IV dose & give PRNs Q 2-3 hours. -Monitor CBC. -Monitor bilirubin/LDH.  Current bilirubin: 10.5, LDH 567. -Ferritin level last checked 05/10/2013, and was 691. No current indication for Desferal. -Not on Hydrea, no electrophoresis results in the record.  Ordered, followup results. May benefit from [facility] if fetal hemoglobin less than 10%. -KVO IV fluids.  -K pad PRN. -Continue folic acid. Active Problems: Cerumen impaction -Irrigate ear canals daily per nursing staff. Debrox twice a day. Hypokalemia -Resolved with IV fluids containing potassium. HIV (human immunodeficiency virus infection) -Not on HAART. CD4 count 560. Chronic Anemia -Patient's baseline hemoglobin is: 7.5-8.5. -Patient's transfusion hemoglobin threshold is: Less than 7 mg/dL. -Status post a total of 2 units of packed red blood cells. Hemoglobin today is 7.0 mg/dL. We will give an exchange transfusion today. Leukocytosis, unspecified -Resolved.  Code Status: Full. Family Communication: No family at the bedside. Disposition Plan: Home when stable.   Medical Consultants:  None.  Other  Consultants:  None.  Anti-infectives:  None.  HPI/Subjective: Matt Hartel continues to have 10/10 sickle cell pain needing IV opiates for relief. Denies nausea, vomiting or constipation. Complains of feeling like his left ear is blocked.  Objective: Filed Vitals:   05/15/13 2214 05/16/13 0203 05/16/13 0449 05/16/13 1007  BP: 132/74 146/77 140/78 135/74  Pulse: 63 78 86 100  Temp: 98.7 F (37.1 C) 98.8 F (37.1 C) 98.8 F (37.1 C) 98.8 F (37.1 C)  TempSrc: Oral Oral Oral Oral  Resp: 16 16 14 16   Height:      Weight:   60.238 kg (132 lb 12.8 oz)   SpO2: 100% 91% 93% 93%    Intake/Output Summary (Last 24 hours) at 05/16/13 1103 Last data filed at 05/16/13 0600  Gross per 24 hour  Intake 2218.75 ml  Output   2125 ml  Net  93.75 ml    Exam: Gen:  NAD Ears: Bilateral cerumen impaction, worse on left Cardiovascular:  RRR, No M/R/G Respiratory:  Lungs CTAB Gastrointestinal:  Abdomen soft, NT/ND, + BS Extremities:  No C/E/C  Data Reviewed: Basic Metabolic Panel:  Recent Labs Lab 05/12/13 0325 05/13/13 0408 05/14/13 0400 05/15/13 0448 05/16/13 0356  NA 135 135 133* 133* 133*  K 3.3* 3.5 3.4* 4.5 3.6  CL 100 101 98 100 98  CO2 29 31 29 29 31   GLUCOSE 104* 96 82 97 92  BUN 4* 3* 5* 8 5*  CREATININE 0.52 0.49* 0.50 0.62 0.54  CALCIUM 8.7 8.9 9.1 8.9 9.2   GFR Estimated Creatinine Clearance: 120.2 ml/min (by C-G formula based on Cr of 0.54). Liver Function Tests:  Recent Labs Lab 05/12/13 0325 05/13/13 0408 05/14/13 0400 05/15/13 0448 05/16/13 0356  AST 72* 81* 76* 77* 68*  ALT 29 37 36 32 34  ALKPHOS 79 87 93 89 97  BILITOT 11.8* 11.4* 12.8* 11.7* 10.5*  PROT 9.0* 9.0* 9.2* 9.5* 9.4*  ALBUMIN 3.3* 3.2* 3.2* 3.3* 3.4*    CBC:  Recent Labs Lab 05/09/13 1235  05/12/13 0325 05/13/13 0408 05/14/13 0400 05/15/13 0448 05/16/13 0356  WBC 13.2*  < > 13.6* 9.9 9.5 9.4 11.3*  NEUTROABS 7.8*  --   --   --   --   --   --   HGB 6.8*  < > 6.7* 7.3*  7.8* 7.5* 7.0*  HCT 19.1*  < > 17.5* 20.4* 21.6* 21.3* 19.9*  MCV 89.7  < > 83.3 84.0 84.0 85.2 84.7  PLT 353  < > 285 279 280 294 295  < > = values in this interval not displayed. Microbiology No results found for this or any previous visit (from the past 240 hour(s)).   Procedures and Diagnostic Studies: No results found.  Scheduled Meds: . aspirin EC  81 mg Oral Daily  . carbamide peroxide  5 drop Both Ears BID  . folic acid  1 mg Oral Daily  . ketorolac  30 mg Intravenous Q6H   Continuous Infusions: . sodium chloride 0.45 % with kcl 75 mL/hr at 05/16/13 0446    Time spent: 25 minutes.   LOS: 7 days   Reesa Gotschall  [facility] Pager [phone].   *Please note that the hospitalists switch teams on Wednesdays. Please call the flow manager at [phone] if you are having difficulty reaching the hospitalist taking care of this patient as she can update you and provide the most up-to-date pager number of provider caring for the patient. If 8PM-8AM, please contact night-coverage at [email or web address], password [facility]  05/16/2013, 11:03 AM

## 2013-05-17 ENCOUNTER — Ambulatory Visit: Payer: Medicaid Other | Admitting: Internal Medicine

## 2013-05-17 LAB — TYPE AND SCREEN
ABO/RH(D): A POS
Antibody Screen: NEGATIVE
Unit division: 0

## 2013-05-17 LAB — CBC
HCT: 22 % — ABNORMAL LOW (ref 39.0–52.0)
Hemoglobin: 7.9 g/dL — ABNORMAL LOW (ref 13.0–17.0)
MCH: 30.7 pg (ref 26.0–34.0)
MCV: 85.6 fL (ref 78.0–100.0)
RBC: 2.57 MIL/uL — ABNORMAL LOW (ref 4.22–5.81)
WBC: 9.3 10*3/uL (ref 4.0–10.5)

## 2013-05-17 LAB — COMPREHENSIVE METABOLIC PANEL
AST: 67 U/L — ABNORMAL HIGH (ref 0–37)
BUN: 5 mg/dL — ABNORMAL LOW (ref 6–23)
CO2: 29 mEq/L (ref 19–32)
Calcium: 8.8 mg/dL (ref 8.4–10.5)
Chloride: 98 mEq/L (ref 96–112)
Creatinine, Ser: 0.53 mg/dL (ref 0.50–1.35)
GFR calc Af Amer: >90 mL/min (ref >90)
GFR calc non Af Amer: >90 mL/min (ref >90)
Glucose, Bld: 87 mg/dL (ref 70–99)
Total Bilirubin: 8.9 mg/dL — ABNORMAL HIGH (ref 0.3–1.2)

## 2013-05-17 LAB — HEMOGLOBINOPATHY EVALUATION: Hgb F Quant: 4 % — ABNORMAL HIGH (ref 0.0–2.0)

## 2013-05-17 NOTE — Progress Notes (Addendum)
[facility] PROGRESS NOTE  Jeremy Mclaughlin ZOX:[id number] DOB: 11/16/1985 DOA: 05/09/2013 PCP: No PCP Per Patient  Brief narrative: Jeremy Mclaughlin is an 27 y.o. male with a PMH of sickle cell/hemoglobin SS disease, HIV (CD4 600 on 02/2013) who was admitted on 05/09/2013 with an acute sickle cell crisis.   Assessment/Plan: Principal Problem: Sickle cell pain crisis -Pain management: Not on routine long-acting opiates at home. -Continue PRN bolus doses of dilaudid to 2mg  Q 2 hours while in acute crisis.   -Toradol IV Q 6 hours for anti-inflammatory effect.  Monitor renal function. -Wean IV narcotics when pain rated 7/10.  Today the patient's pain is a level 10,  (when weaning, decrease dose first, then frequency). -When able to tolerate oral therapy, convert at 50-75% of IV dose & give PRNs Q 2-3 hours. -Monitor CBC. -Monitor bilirubin/LDH.  Current bilirubin: 8.9, LDH 567. -Ferritin level last checked 05/10/2013, and was 691. No current indication for Desferal. -Not on Hydrea, no electrophoresis results in the record.  Hemoglobin F = 4%, so would benefit from Hydrea.  Does not have a PCP, so would not start until he has appropriate follow up and proves he will keep his appointments.  Would refer to the sickle cell program at discharge. -K pad PRN. -Continue folic acid. Active Problems: Cerumen impaction -Irrigate ear canals daily per nursing staff. Debrox twice a day. Hypokalemia -Resolved with IV fluids containing potassium. HIV (human immunodeficiency virus infection) -Not on HAART. CD4 count 560. Chronic Anemia -Patient's baseline hemoglobin is: 7.5-8.5. -Patient's transfusion hemoglobin threshold is: Less than 7 mg/dL. -Status post a total of 2 units of packed red blood cells. Hemoglobin today is 7.9 mg/dL. S/P exchange transfusion 05/16/13. Leukocytosis, unspecified -Resolved.  Code Status: Full. Family Communication: No family at the bedside. Disposition Plan: Home when  stable.   Medical Consultants:  None.  Other Consultants:  None.  Anti-infectives:  None.  HPI/Subjective: Jeremy Mclaughlin continues to have 10/10 sickle cell pain needing IV opiates for relief. Had some N/V today and appetite poor.  Objective: Filed Vitals:   05/16/13 2102 05/16/13 2200 05/17/13 0252 05/17/13 0630  BP: 155/89 157/86 136/80 139/89  Pulse: 80 74 78 74  Temp: 98.9 F (37.2 C) 98.5 F (36.9 C) 99.3 F (37.4 C) 98.9 F (37.2 C)  TempSrc: Oral Oral Oral Oral  Resp: 18 20 20 20   Height:      Weight:    59.9 kg (132 lb 0.9 oz)  SpO2:  99% 94% 100%    Intake/Output Summary (Last 24 hours) at 05/17/13 0757 Last data filed at 05/17/13 0548  Gross per 24 hour  Intake 1264.58 ml  Output   1100 ml  Net 164.58 ml    Exam: Gen:  NAD Ears: Bilateral cerumen impaction, worse on left Cardiovascular:  RRR, No M/R/G Respiratory:  Lungs CTAB Gastrointestinal:  Abdomen soft, NT/ND, + BS Extremities:  No C/E/C  Data Reviewed: Basic Metabolic Panel:  Recent Labs Lab 05/13/13 0408 05/14/13 0400 05/15/13 0448 05/16/13 0356 05/17/13 0356  NA 135 133* 133* 133* 134*  K 3.5 3.4* 4.5 3.6 3.5  CL 101 98 100 98 98  CO2 31 29 29 31 29   GLUCOSE 96 82 97 92 87  BUN 3* 5* 8 5* 5*  CREATININE 0.49* 0.50 0.62 0.54 0.53  CALCIUM 8.9 9.1 8.9 9.2 8.8   GFR Estimated Creatinine Clearance: 119.6 ml/min (by C-G formula based on Cr of 0.53). Liver Function Tests:  Recent Labs Lab 05/13/13 0408 05/14/13  0400 05/15/13 0448 05/16/13 0356 05/17/13 0356  AST 81* 76* 77* 68* 67*  ALT 37 36 32 34 32  ALKPHOS 87 93 89 97 89  BILITOT 11.4* 12.8* 11.7* 10.5* 8.9*  PROT 9.0* 9.2* 9.5* 9.4* 9.0*  ALBUMIN 3.2* 3.2* 3.3* 3.4* 3.2*    CBC:  Recent Labs Lab 05/13/13 0408 05/14/13 0400 05/15/13 0448 05/16/13 0356 05/17/13 0356  WBC 9.9 9.5 9.4 11.3* 9.3  HGB 7.3* 7.8* 7.5* 7.0* 7.9*  HCT 20.4* 21.6* 21.3* 19.9* 22.0*  MCV 84.0 84.0 85.2 84.7 85.6  PLT 279 280  294 295 296   Microbiology No results found for this or any previous visit (from the past 240 hour(s)).   Procedures and Diagnostic Studies: No results found.  Scheduled Meds: . aspirin EC  81 mg Oral Daily  . carbamide peroxide  5 drop Both Ears BID  . folic acid  1 mg Oral Daily   Continuous Infusions: . sodium chloride 0.45 % with kcl 20 mL/hr at 05/17/13 0549    Time spent: 25 minutes.   LOS: 8 days   RAMA,CHRISTINA  [facility] Pager [phone].   *Please note that the hospitalists switch teams on Wednesdays. Please call the flow manager at [phone] if you are having difficulty reaching the hospitalist taking care of this patient as she can update you and provide the most up-to-date pager number of provider caring for the patient. If 8PM-8AM, please contact night-coverage at [email or web address], password [facility]  05/17/2013, 7:57 AM

## 2013-05-18 LAB — COMPREHENSIVE METABOLIC PANEL
ALT: 35 U/L (ref 0–53)
AST: 77 U/L — ABNORMAL HIGH (ref 0–37)
Alkaline Phosphatase: 80 U/L (ref 39–117)
CO2: 30 mEq/L (ref 19–32)
Calcium: 8.7 mg/dL (ref 8.4–10.5)
GFR calc non Af Amer: >90 mL/min (ref >90)
Potassium: 4.3 mEq/L (ref 3.5–5.1)
Sodium: 132 mEq/L — ABNORMAL LOW (ref 135–145)

## 2013-05-18 LAB — CBC
Hemoglobin: 8.1 g/dL — ABNORMAL LOW (ref 13.0–17.0)
MCH: 30.1 pg (ref 26.0–34.0)
Platelets: 299 10*3/uL (ref 150–400)
RBC: 2.69 MIL/uL — ABNORMAL LOW (ref 4.22–5.81)
WBC: 9.4 10*3/uL (ref 4.0–10.5)

## 2013-05-18 MED ORDER — PROMETHAZINE HCL 25 MG PO TABS: 25.0000 mg | ORAL_TABLET | Freq: Four times a day (QID) | ORAL | Status: DC | PRN

## 2013-05-18 MED ORDER — DIPHENHYDRAMINE HCL 25 MG PO CAPS: 25.0000 mg | ORAL_CAPSULE | Freq: Four times a day (QID) | ORAL | Status: DC | PRN

## 2013-05-18 MED ORDER — HYDROCODONE-ACETAMINOPHEN 5-325 MG PO TABS: 1.0000 | ORAL_TABLET | ORAL | Status: DC | PRN

## 2013-05-18 MED ORDER — ONDANSETRON HCL 4 MG PO TABS: 4.0000 mg | ORAL_TABLET | Freq: Three times a day (TID) | ORAL | Status: DC | PRN

## 2013-05-18 MED ORDER — HYDROMORPHONE HCL 2 MG PO TABS: 2.0000 mg | ORAL_TABLET | ORAL | Status: DC | PRN

## 2013-05-18 NOTE — Care Management Note (Addendum)
Cm spoke with Case Manager at [facility] concerning pt's missed appointment yesterday 9/2 due to inpatient status on 3e Oncology/Hematology. Case manager of [facility] to reschedule pt's appointment to establish primary care with Dr.Michelle [name] prior to discharge.   Roxy Manns Metha Kolasa,RN,MSN [phone]

## 2013-05-18 NOTE — Discharge Summary (Signed)
Physician Discharge Summary  Jeremy Mclaughlin XBJ:[id number] DOB: 23-Sep-1985 DOA: 05/09/2013  PCP: No PCP Per Patient  Admit date: 05/09/2013 Discharge date: 05/18/2013  Recommendations for Outpatient Follow-up:  1. You were hospitalized for acute sickle cell crisis. You may continue current home pain medication regimen with Norco by mouth when necessary as well is Dilaudid 2 mg every 4 hours as needed for better pain control  2. continue taking Phenergan 25 mg as needed every 6 hours for nausea or vomiting. For intractable nausea or vomiting you may use Zofran as needed every 8 hours  3. your hemoglobin is stable at 8.1.  4. please followup with Dr. Ashley Mclaughlin Monday, 05/23/2013 per scheduled appointment.   Discharge Diagnoses:  Principal Problem:   Sickle cell pain crisis Active Problems:   HIV (human immunodeficiency virus infection)   Anemia   Leukocytosis, unspecified   Cerumen impaction    Discharge Condition: Medically stable for discharge home today  Diet recommendation: As tolerated  History of present illness:  27 y.o. male with a PMH of sickle cell/hemoglobin SS disease, HIV (CD4 600 on 02/2013) who was admitted on 05/09/2013 with an acute sickle cell crisis.   Assessment/Plan:   Principal Problem:  Sickle cell pain crisis  - Pain management: Norco by mouth when necessary, Dilaudid 2 mg every 4 hours PO as needed for severe pain - Patient is stable for discharge home today. - Ferritin level last checked 05/10/2013, and was 691. No current indication for Desferal.  - Not on Hydrea, no electrophoresis results in the record. Hemoglobin F = 4%, so would benefit from Hydrea. Patient will followup with Dr. Ashley Mclaughlin 05/23/2013 per scheduled appointment  - Continue folic acid.  Active Problems:  Cerumen impaction  - Irrigate ear canals daily per nursing staff. Debrox twice a day.  Hypokalemia  - Resolved with IV fluids containing potassium.  HIV (human immunodeficiency virus  infection)  - Not on HAART. CD4 count 560.  Chronic Anemia  - Patient's baseline hemoglobin is: 7.5-8.5.  - Patient's transfusion hemoglobin threshold is: Less than 7 mg/dL.  - Status post a total of 2 units of packed red blood cells. Hemoglobin today 8.1 mg/dL. S/P exchange transfusion 05/16/13.  Leukocytosis, unspecified  - Resolved.   Code Status: Full.  Family Communication: No family at the bedside.   Medical Consultants:  None. Other Consultants:  None. Anti-infectives:  None.   SignedManson Passey, MD  [facility] 05/18/2013, 12:55 PM  Pager #: [phone]   Discharge Exam: Filed Vitals:   05/18/13 0944  BP: 139/83  Pulse: 77  Temp: 98.5 F (36.9 C)  Resp: 16   Filed Vitals:   05/18/13 0144 05/18/13 0451 05/18/13 0625 05/18/13 0944  BP: 127/78 125/72  139/83  Pulse: 62 72  77  Temp: 98.4 F (36.9 C) 98.3 F (36.8 C)  98.5 F (36.9 C)  TempSrc: Oral Oral  Oral  Resp: 16 16  16   Height:      Weight:   59.92 kg (132 lb 1.6 oz)   SpO2: 94% 98%  99%    General: Pt is alert, follows commands appropriately, not in acute distress Cardiovascular: Regular rate and rhythm, S1/S2 +, no murmurs, no rubs, no gallops Respiratory: Clear to auscultation bilaterally, no wheezing, no crackles, no rhonchi Abdominal: Soft, non tender, non distended, bowel sounds +, no guarding Extremities: no edema, no cyanosis, pulses palpable bilaterally DP and PT Neuro: Grossly nonfocal  Discharge Instructions  Discharge Orders   Future Appointments  Provider Department Dept Phone   05/23/2013 2:00 PM Altha Harm, MD [facility] [phone]   Future Orders Complete By Expires   Call MD for:  difficulty breathing, headache or visual disturbances  As directed    Call MD for:  persistant dizziness or light-headedness  As directed    Call MD for:  persistant nausea and vomiting  As directed    Call MD for:  severe uncontrolled pain  As directed     Diet - low sodium heart healthy  As directed    Discharge instructions  As directed    Comments:     1. You were hospitalized for acute sickle cell crisis. You may continue current home pain medication regimen with Norco by mouth when necessary as well is Dilaudid 2 mg every 4 hours as needed for better pain control 2. continue taking Phenergan 25 mg as needed every 6 hours for nausea or vomiting. For intractable nausea or vomiting you may use Zofran as needed every 8 hours 3. your hemoglobin is stable at 8.1. 4. please followup with Dr. Ashley Mclaughlin Monday, 05/23/2013 per scheduled appointment.   Increase activity slowly  As directed        Medication List         aspirin EC 81 MG tablet  Take 81 mg by mouth daily.     diphenhydrAMINE 25 mg capsule  Commonly known as:  BENADRYL  Take 1-2 capsules (25-50 mg total) by mouth every 6 (six) hours as needed for itching or allergies.     folic acid 1 MG tablet  Commonly known as:  FOLVITE  1 mg. Take 1 tablet (1 mg total) by mouth daily.     Folic Acid 20 MG Caps  Take 25 mg by mouth daily.     HYDROcodone-acetaminophen 5-325 MG per tablet  Commonly known as:  NORCO/VICODIN  Take 1-2 tablets by mouth every 4 (four) hours as needed.     HYDROmorphone 2 MG tablet  Commonly known as:  DILAUDID  Take 1 tablet (2 mg total) by mouth every 4 (four) hours as needed for pain.     ondansetron 4 MG tablet  Commonly known as:  ZOFRAN  Take 1 tablet (4 mg total) by mouth every 8 (eight) hours as needed for nausea (for intractable nausea and/or vomiting).     promethazine 25 MG tablet  Commonly known as:  PHENERGAN  Take 1 tablet (25 mg total) by mouth every 6 (six) hours as needed for nausea.           Follow-up Information   Follow up with MATTHEWS,MICHELLE A., MD On 05/23/2013.   Specialty:  Internal Medicine   Contact information:   [address] [place] [address] [phone]        The results of significant  diagnostics from this hospitalization (including imaging, microbiology, ancillary and laboratory) are listed below for reference.    Significant Diagnostic Studies: No results found.  Microbiology: No results found for this or any previous visit (from the past 240 hour(s)).   Labs: Basic Metabolic Panel:  Recent Labs Lab 05/14/13 0400 05/15/13 0448 05/16/13 0356 05/17/13 0356 05/18/13 0330  NA 133* 133* 133* 134* 132*  K 3.4* 4.5 3.6 3.5 4.3  CL 98 100 98 98 99  CO2 29 29 31 29 30   GLUCOSE 82 97 92 87 91  BUN 5* 8 5* 5* 6  CREATININE 0.50 0.62 0.54 0.53 0.58  CALCIUM 9.1 8.9 9.2 8.8 8.7   Liver Function Tests:  Recent Labs Lab 05/14/13 0400 05/15/13 0448 05/16/13 0356 05/17/13 0356 05/18/13 0330  AST 76* 77* 68* 67* 77*  ALT 36 32 34 32 35  ALKPHOS 93 89 97 89 80  BILITOT 12.8* 11.7* 10.5* 8.9* 7.8*  PROT 9.2* 9.5* 9.4* 9.0* 9.2*  ALBUMIN 3.2* 3.3* 3.4* 3.2* 3.0*   No results found for this basename: LIPASE, AMYLASE,  in the last 168 hours No results found for this basename: AMMONIA,  in the last 168 hours CBC:  Recent Labs Lab 05/14/13 0400 05/15/13 0448 05/16/13 0356 05/17/13 0356 05/18/13 0330  WBC 9.5 9.4 11.3* 9.3 9.4  HGB 7.8* 7.5* 7.0* 7.9* 8.1*  HCT 21.6* 21.3* 19.9* 22.0* 23.5*  MCV 84.0 85.2 84.7 85.6 87.4  PLT 280 294 295 296 299   Cardiac Enzymes: No results found for this basename: CKTOTAL, CKMB, CKMBINDEX, TROPONINI,  in the last 168 hours BNP: BNP (last 3 results) No results found for this basename: PROBNP,  in the last 8760 hours CBG: No results found for this basename: GLUCAP,  in the last 168 hours  Time coordinating discharge: Over 30 minutes

## 2013-05-18 NOTE — Care Management (Signed)
This CM spoke with Jeremy Mclaughlin regarding his rescheduled appointment on 05/23/13 @ 2pm to establish care with Dr. [name]. [name] verbalized understanding and had no additional questions.   CM T.Davis notified.   Jeremy Caldwell, RN, BSN, [place]    [phone]

## 2013-05-23 ENCOUNTER — Ambulatory Visit (INDEPENDENT_AMBULATORY_CARE_PROVIDER_SITE_OTHER): Payer: Medicaid Other | Admitting: Internal Medicine

## 2013-05-23 ENCOUNTER — Encounter: Payer: Self-pay | Admitting: Internal Medicine

## 2013-05-23 VITALS — BP 141/75 | HR 91 | Temp 99.7°F | Resp 12 | Ht 66.5 in | Wt 130.0 lb

## 2013-05-23 DIAGNOSIS — I675 Moyamoya disease: Secondary | ICD-10-CM | POA: Insufficient documentation

## 2013-05-23 DIAGNOSIS — D571 Sickle-cell disease without crisis: Secondary | ICD-10-CM

## 2013-05-23 MED ORDER — FOLIC ACID 1 MG PO TABS: 1.0000 mg | ORAL_TABLET | Freq: Every day | ORAL | Status: DC

## 2013-05-23 NOTE — Progress Notes (Signed)
Subjective:    Patient ID: Jeremy Mclaughlin, male    DOB: 1985-12-30, 27 y.o.   MRN: [id number]  HPI: This is a patient here to establish care for Primary care and to discuss management of Sickle Cell Disease. 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 He also has had intra-cerebral venous reconstruction for increased circulation.  Pt is also HIV (+) and has not ever been started on HAART therapy. He had an appointment set up with Dr. Drue Second (Infectious Disease) which was cancelled due to insurance limitations.  Pt was recently hospitalized with Acute Vaos-occlusive episode and was discharged home on 2 short acting medications. Pt states that he filled the prescription for Dilaudid but has not used it as he is afraid of it's effect and the possibility of tolerance and dependence. Pt denies any history of substance abuse, dependence or psychiatric disorders in his family. He normally takes an average of 2 vicodin (5/325 mg) on days that he experiences pain and has many days that he is pain free and requires no analgesia. He has had about 8 admission for pain in the last year. He states that he drinks about 10 8 oz bottles of water /day to maintain a hydrated state. Today he rates his pain at 4/10 which is tolerable and allows for normal function.    Review of Systems  Constitutional: Negative.   HENT: Negative.   Eyes: Negative.   Respiratory: Negative.   Cardiovascular: Negative.   Gastrointestinal: Negative.   Endocrine: Negative.   Genitourinary: Negative.   Musculoskeletal: Positive for myalgias. Negative for arthralgias.  Skin: Negative.   Allergic/Immunologic: Negative.   Neurological: Negative.   Hematological: Negative.   Psychiatric/Behavioral: Negative.        Objective:   Physical Exam  Constitutional: He is oriented to person, place, and time. He appears well-developed and well-nourished.  HENT:  Head: Atraumatic.  Eyes: Conjunctivae and EOM are normal. Pupils  are equal, round, and reactive to light. No scleral icterus.  Neck: Normal range of motion. Neck supple.  Cardiovascular: Normal rate and regular rhythm.  Exam reveals no gallop and no friction rub.   No murmur heard. Pulmonary/Chest: Effort normal and breath sounds normal. He has no wheezes. He has no rales. He exhibits no tenderness.  Abdominal: Soft. Bowel sounds are normal. He exhibits no mass.  Musculoskeletal: Normal range of motion.  Neurological: He is alert and oriented to person, place, and time.  Skin: Skin is warm and dry.  Psychiatric: He has a normal mood and affect. His behavior is normal. Judgment and thought content normal.          Assessment & Plan:  1. Hb SS with Moya-Moya syndrome and S/P venous reconstruction: Pt has been under the care of Hematologist at [facility]. Would recommend continuing specialty care at [facility].  2. 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 Pt referred back to Hematologist for continued serial transfusions and/or consideration of Hydrea  3. HIV: Pt to follow up with rescheduling appointment with Dr. Loel Dubonnet. Will not obtain any labs to investigate CD4 count. Will defer to ID. Last CD4 560 if falls below will need prophylaxis. Not currently on HAART therapy.  4. Chronic Pain: Pt advised to not use Dilaudid. He will bring them in for pill count when he returns for labs. He is to continue use of Vicodin and we will re-assess effectiveness as necessary. Pt also counseled on meditation, guided  imagery, massages and exercise as a means of managing pain

## 2013-05-24 ENCOUNTER — Ambulatory Visit: Payer: Medicaid Other

## 2013-05-30 ENCOUNTER — Other Ambulatory Visit: Payer: Medicaid Other

## 2013-06-06 ENCOUNTER — Ambulatory Visit: Payer: Medicaid Other | Admitting: Internal Medicine

## 2013-07-26 ENCOUNTER — Other Ambulatory Visit: Payer: Self-pay | Admitting: Internal Medicine

## 2013-07-26 ENCOUNTER — Encounter: Payer: Self-pay | Admitting: Internal Medicine

## 2013-07-26 ENCOUNTER — Other Ambulatory Visit (HOSPITAL_COMMUNITY)
Admission: RE | Admit: 2013-07-26 | Discharge: 2013-07-26 | Disposition: A | Discharge status: Home / Self Care | Payer: Medicaid Other | Source: Ambulatory Visit | Attending: Internal Medicine | Admitting: Internal Medicine

## 2013-07-26 ENCOUNTER — Ambulatory Visit (INDEPENDENT_AMBULATORY_CARE_PROVIDER_SITE_OTHER): Payer: Medicaid Other | Admitting: Internal Medicine

## 2013-07-26 VITALS — BP 145/92 | HR 74 | Temp 98.1°F | Ht 65.0 in | Wt 138.0 lb

## 2013-07-26 DIAGNOSIS — Z23 Encounter for immunization: Secondary | ICD-10-CM

## 2013-07-26 DIAGNOSIS — Z113 Encounter for screening for infections with a predominantly sexual mode of transmission: Secondary | ICD-10-CM

## 2013-07-26 DIAGNOSIS — Z79899 Other long term (current) drug therapy: Secondary | ICD-10-CM

## 2013-07-26 DIAGNOSIS — Z21 Asymptomatic human immunodeficiency virus [HIV] infection status: Secondary | ICD-10-CM

## 2013-07-26 DIAGNOSIS — B20 Human immunodeficiency virus [HIV] disease: Secondary | ICD-10-CM

## 2013-07-26 LAB — COMPLETE METABOLIC PANEL WITH GFR
ALT: 26 U/L (ref 0–53)
Albumin: 3.9 g/dL (ref 3.5–5.2)
Alkaline Phosphatase: 79 U/L (ref 39–117)
CO2: 28 mEq/L (ref 19–32)
GFR, Est Non African American: >89 mL/min
Glucose, Bld: 77 mg/dL (ref 70–99)
Potassium: 4.2 mEq/L (ref 3.5–5.3)
Sodium: 132 mEq/L — ABNORMAL LOW (ref 135–145)
Total Bilirubin: 6.1 mg/dL — ABNORMAL HIGH (ref 0.3–1.2)
Total Protein: 10.7 g/dL — ABNORMAL HIGH (ref 6.0–8.3)

## 2013-07-26 LAB — RPR

## 2013-07-26 LAB — LIPID PANEL
LDL Cholesterol: 71 mg/dL (ref 0–99)
Triglycerides: 57 mg/dL (ref <150)
VLDL: 11 mg/dL (ref 0–40)

## 2013-07-26 MED ORDER — ELVITEG-COBIC-EMTRICIT-TENOFDF 150-150-200-300 MG PO TABS: 1.0000 | ORAL_TABLET | Freq: Every day | ORAL | Status: DC

## 2013-07-26 NOTE — Assessment & Plan Note (Signed)
He is ready to start therapy and I discussed options and he will start Stribild. Since he has excess, he will start in the a.m. And I will check his labs today prior to starting and then he will return in 3 weeks  To recheck his labs. I will then see him one week after that. All questions answered. The patient is aware to call if he has any problems with co-pays, insurance issues or otherwise.

## 2013-07-26 NOTE — Progress Notes (Signed)
  Subjective:    Patient ID: Jeremy Mclaughlin, male    DOB: 02/06/1986, 28 y.o.   MRN: [id number]  HPI Here to establish care as a new patient. He was initially diagnosed with HIV in [place] about 2-1/2 years ago in 2012.  He then moved to [place] and then later to [place]. He previously was on Medicaid in [place] and this has since been transferred to [facility] and he is now ready to establish care. 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 He has establish primary care with the sickle cell physician Dr. [name], and is here to establish HIV care. He has never been on therapy before. He is interested in starting therapy and feels he will take medication well daily. He has no concerns with starting the medication very his last CD4 count was 770 which was done at [facility]. His recent creatinine this year was in normal limits. He has no questions with HIV. No history of other sexual transmitted infections and no history of hospitalizations. No weight loss, no diarrhea.   Review of Systems  Constitutional: Negative for fever, activity change, appetite change, fatigue and unexpected weight change.  HENT: Negative for trouble swallowing.   Eyes: Negative for visual disturbance.  Respiratory: Negative for cough and shortness of breath.   Cardiovascular: Negative for chest pain and leg swelling.  Gastrointestinal: Negative for nausea, abdominal pain and diarrhea.  Endocrine: Negative for polyuria.  Genitourinary: Negative for hematuria.  Musculoskeletal: Negative for arthralgias.  Skin: Negative for rash.  Neurological: Negative for dizziness, weakness, light-headedness and headaches.  Hematological: Negative for adenopathy.  Psychiatric/Behavioral: Negative for dysphoric mood. The patient is not nervous/anxious.        Objective:   Physical Exam  Constitutional: He is oriented to person, place, and time. He appears well-developed  and well-nourished. No distress.  HENT:  Mouth/Throat: Oropharynx is clear and moist. No oropharyngeal exudate.  Eyes: Right eye exhibits no discharge. Left eye exhibits no discharge. No scleral icterus.  No icterus at this time  Cardiovascular: Normal rate, regular rhythm and normal heart sounds.   No murmur heard. Pulmonary/Chest: Effort normal and breath sounds normal. No respiratory distress. He has no wheezes.  Abdominal: Soft. Bowel sounds are normal. He exhibits no distension. There is no tenderness.  Musculoskeletal: Normal range of motion.  Lymphadenopathy:    He has no cervical adenopathy.  Neurological: He is alert and oriented to person, place, and time.  Skin: Skin is warm and dry. No rash noted.  Psychiatric: He has a normal mood and affect. His behavior is normal.          Assessment & Plan:

## 2013-07-27 ENCOUNTER — Telehealth: Payer: Self-pay | Admitting: *Deleted

## 2013-07-27 LAB — CBC WITH DIFFERENTIAL/PLATELET
Basophils Absolute: 0.1 10*3/uL (ref 0.0–0.1)
Basophils Relative: 1 % (ref 0–1)
Eosinophils Absolute: 0.2 10*3/uL (ref 0.0–0.7)
Eosinophils Relative: 2 % (ref 0–5)
HCT: 23.2 % — ABNORMAL LOW (ref 39.0–52.0)
Hemoglobin: 8.1 g/dL — ABNORMAL LOW (ref 13.0–17.0)
MCH: 30 pg (ref 26.0–34.0)
MCHC: 34.9 g/dL (ref 30.0–36.0)
Monocytes Absolute: 0.8 10*3/uL (ref 0.1–1.0)
Monocytes Relative: 10 % (ref 3–12)
RDW: 19 % — ABNORMAL HIGH (ref 11.5–15.5)

## 2013-07-27 LAB — T-HELPER CELL (CD4) - (RCID CLINIC ONLY)
CD4 % Helper T Cell: 15 % — ABNORMAL LOW (ref 33–55)
CD4 T Cell Abs: 750 /uL (ref 400–2700)

## 2013-07-27 LAB — HIV-1 RNA ULTRAQUANT REFLEX TO GENTYP+: HIV-1 RNA Quant, Log: 3.71 {Log} — ABNORMAL HIGH (ref <1.30)

## 2013-07-27 NOTE — Telephone Encounter (Signed)
Ok, has sickle cell so likely related to that and not new. thanks

## 2013-07-27 NOTE — Telephone Encounter (Signed)
Received call from [facility] labs with alert high Total Protein of 10.7 drawn 07/26/13 will forward information to Dr Luciana Axe.

## 2013-08-02 LAB — HIV-1 GENOTYPR PLUS

## 2013-08-04 ENCOUNTER — Encounter: Payer: Self-pay | Admitting: Internal Medicine

## 2013-08-23 ENCOUNTER — Ambulatory Visit: Payer: Medicaid Other | Admitting: Internal Medicine

## 2013-08-23 ENCOUNTER — Telehealth: Payer: Self-pay | Admitting: *Deleted

## 2013-08-23 NOTE — Telephone Encounter (Signed)
Called patient regarding today's no show appt. Dr. Luciana Axe was wanting to see him after he started meds and he has not started yet. He said he is going to start today. He was driving so I told him to call and schedule an appt for 2 weeks from today. Jeremy Mclaughlin

## 2013-08-25 ENCOUNTER — Emergency Department (HOSPITAL_COMMUNITY): Payer: Medicaid Other

## 2013-08-25 ENCOUNTER — Inpatient Hospital Stay (HOSPITAL_COMMUNITY)
Admission: EM | Admit: 2013-08-25 | Discharge: 2013-08-31 | DRG: 811 | Disposition: A | Discharge status: Home / Self Care | Payer: Medicaid Other | Attending: Internal Medicine | Admitting: Internal Medicine

## 2013-08-25 ENCOUNTER — Encounter (HOSPITAL_COMMUNITY): Payer: Self-pay | Admitting: Emergency Medicine

## 2013-08-25 DIAGNOSIS — I675 Moyamoya disease: Secondary | ICD-10-CM

## 2013-08-25 DIAGNOSIS — E871 Hypo-osmolality and hyponatremia: Secondary | ICD-10-CM | POA: Diagnosis present

## 2013-08-25 DIAGNOSIS — D599 Acquired hemolytic anemia, unspecified: Secondary | ICD-10-CM | POA: Diagnosis present

## 2013-08-25 DIAGNOSIS — D5700 Hb-SS disease with crisis, unspecified: Principal | ICD-10-CM

## 2013-08-25 DIAGNOSIS — Z8673 Personal history of transient ischemic attack (TIA), and cerebral infarction without residual deficits: Secondary | ICD-10-CM | POA: Diagnosis present

## 2013-08-25 DIAGNOSIS — L0100 Impetigo, unspecified: Secondary | ICD-10-CM

## 2013-08-25 DIAGNOSIS — B20 Human immunodeficiency virus [HIV] disease: Secondary | ICD-10-CM | POA: Diagnosis present

## 2013-08-25 DIAGNOSIS — Z79899 Other long term (current) drug therapy: Secondary | ICD-10-CM

## 2013-08-25 DIAGNOSIS — Z8249 Family history of ischemic heart disease and other diseases of the circulatory system: Secondary | ICD-10-CM

## 2013-08-25 DIAGNOSIS — A4901 Methicillin susceptible Staphylococcus aureus infection, unspecified site: Secondary | ICD-10-CM

## 2013-08-25 DIAGNOSIS — Z21 Asymptomatic human immunodeficiency virus [HIV] infection status: Secondary | ICD-10-CM

## 2013-08-25 DIAGNOSIS — Z833 Family history of diabetes mellitus: Secondary | ICD-10-CM

## 2013-08-25 DIAGNOSIS — E876 Hypokalemia: Secondary | ICD-10-CM

## 2013-08-25 DIAGNOSIS — Z515 Encounter for palliative care: Secondary | ICD-10-CM

## 2013-08-25 DIAGNOSIS — Z8679 Personal history of other diseases of the circulatory system: Secondary | ICD-10-CM

## 2013-08-25 DIAGNOSIS — D72829 Elevated white blood cell count, unspecified: Secondary | ICD-10-CM

## 2013-08-25 DIAGNOSIS — R17 Unspecified jaundice: Secondary | ICD-10-CM

## 2013-08-25 DIAGNOSIS — F17200 Nicotine dependence, unspecified, uncomplicated: Secondary | ICD-10-CM | POA: Diagnosis present

## 2013-08-25 DIAGNOSIS — D649 Anemia, unspecified: Secondary | ICD-10-CM

## 2013-08-25 DIAGNOSIS — R7881 Bacteremia: Secondary | ICD-10-CM | POA: Diagnosis present

## 2013-08-25 LAB — CBC WITH DIFFERENTIAL/PLATELET
Basophils Relative: 1 % (ref 0–1)
Eosinophils Absolute: 0.3 10*3/uL (ref 0.0–0.7)
Eosinophils Relative: 3 % (ref 0–5)
HCT: 20.3 % — ABNORMAL LOW (ref 39.0–52.0)
Hemoglobin: 7.3 g/dL — ABNORMAL LOW (ref 13.0–17.0)
Lymphocytes Relative: 57 % — ABNORMAL HIGH (ref 12–46)
Lymphs Abs: 5.9 10*3/uL — ABNORMAL HIGH (ref 0.7–4.0)
MCV: 87.9 fL (ref 78.0–100.0)
Monocytes Relative: 10 % (ref 3–12)
Neutrophils Relative %: 29 % — ABNORMAL LOW (ref 43–77)
RBC: 2.31 MIL/uL — ABNORMAL LOW (ref 4.22–5.81)
RDW: 22.1 % — ABNORMAL HIGH (ref 11.5–15.5)
WBC: 10.3 10*3/uL (ref 4.0–10.5)

## 2013-08-25 LAB — COMPREHENSIVE METABOLIC PANEL
ALT: 30 U/L (ref 0–53)
AST: 67 U/L — ABNORMAL HIGH (ref 0–37)
Alkaline Phosphatase: 91 U/L (ref 39–117)
BUN: 5 mg/dL — ABNORMAL LOW (ref 6–23)
CO2: 26 mEq/L (ref 19–32)
Calcium: 9 mg/dL (ref 8.4–10.5)
Chloride: 103 mEq/L (ref 96–112)
GFR calc non Af Amer: >90 mL/min (ref >90)
Glucose, Bld: 95 mg/dL (ref 70–99)
Potassium: 4 mEq/L (ref 3.5–5.1)
Sodium: 135 mEq/L (ref 135–145)

## 2013-08-25 LAB — RETICULOCYTES
RBC.: 2.31 MIL/uL — ABNORMAL LOW (ref 4.22–5.81)
Retic Count, Absolute: 545.2 10*3/uL — ABNORMAL HIGH (ref 19.0–186.0)
Retic Ct Pct: 23.6 % — ABNORMAL HIGH (ref 0.4–3.1)

## 2013-08-25 LAB — D-DIMER, QUANTITATIVE (NOT AT ARMC): D-Dimer, Quant: 1.3 ug/mL-FEU — ABNORMAL HIGH (ref 0.00–0.48)

## 2013-08-25 MED ORDER — SODIUM CHLORIDE 0.9 % IV SOLN
1000.0000 mL | INTRAVENOUS | Status: DC
  Administered 2013-08-26: 1000 mL via INTRAVENOUS

## 2013-08-25 MED ORDER — DIPHENHYDRAMINE HCL 25 MG PO CAPS
50.0000 mg | ORAL_CAPSULE | Freq: Once | ORAL | Status: AC
  Administered 2013-08-25: 50 mg via ORAL
  Filled 2013-08-25 (×2): qty 2

## 2013-08-25 MED ORDER — IOHEXOL 350 MG/ML SOLN
100.0000 mL | Freq: Once | INTRAVENOUS | Status: AC | PRN
  Administered 2013-08-25: 100 mL via INTRAVENOUS

## 2013-08-25 MED ORDER — SODIUM CHLORIDE 0.9 % IV SOLN
1000.0000 mL | Freq: Once | INTRAVENOUS | Status: AC
  Administered 2013-08-25: 1000 mL via INTRAVENOUS

## 2013-08-25 MED ORDER — HYDROMORPHONE HCL PF 2 MG/ML IJ SOLN
2.0000 mg | Freq: Once | INTRAMUSCULAR | Status: AC
  Administered 2013-08-26: 2 mg via INTRAVENOUS
  Filled 2013-08-25: qty 1

## 2013-08-25 MED ORDER — ONDANSETRON HCL 4 MG/2ML IJ SOLN
4.0000 mg | Freq: Once | INTRAMUSCULAR | Status: AC
  Administered 2013-08-25: 4 mg via INTRAVENOUS
  Filled 2013-08-25: qty 2

## 2013-08-25 MED ORDER — HYDROMORPHONE HCL PF 2 MG/ML IJ SOLN
2.0000 mg | INTRAMUSCULAR | Status: AC
  Administered 2013-08-25 (×3): 2 mg via INTRAVENOUS
  Filled 2013-08-25 (×3): qty 1

## 2013-08-25 NOTE — ED Provider Notes (Signed)
CSN: [id number]     Arrival date & time 08/25/13  1847 History   First MD Initiated Contact with Patient 08/25/13 1948     Chief Complaint  Patient presents with  . Sickle Cell Pain Crisis   (Consider location/radiation/quality/duration/timing/severity/associated sxs/prior Treatment) The history is provided by the patient and medical records. No language interpreter was used.   Jeremy Mclaughlin is a 27 y.o. male  with a hx of sickle cell anemia, HIV presents to the Emergency Department complaining of gradual, persistent, progressively worsening low back pain, bilateral leg pain with associated right ankle swelling onset 2 hours PTA.  Pt reports his pain began and he took 2 tylenol without relief.  He reports being out of his hydrocodone.  He reports he attempted to take a shower to ease his pain, but it made things worse and at that time he noticed the swelling of his right ankle.  Pt also c/o associated chest pain without SOB.  Movement and palpation of the right ankle makes it worse.  Pt denies fever, chills, headache, neck pain, abd pain, N/V/D, weakness, dizziness, syncope.      Past Medical History  Diagnosis Date  . Sickle cell anemia   . Immune deficiency disorder   . HIV (human immunodeficiency virus infection)   . Shingles   . Stroke    Past Surgical History  Procedure Laterality Date  . Brain surgery    . Cholecystectomy     Family History  Problem Relation Age of Onset  . Heart disease Father   . Diabetes Father   . Hypertension Maternal Aunt   . Diabetes Maternal Aunt   . Cancer Maternal Grandmother   . Heart disease Mother   . Diabetes Mother    History  Substance Use Topics  . Smoking status: Current Some Day Smoker    Types: Cigarettes  . Smokeless tobacco: Never Used  . Alcohol Use: Yes     Comment: socially    Review of Systems  Constitutional: Negative for fever, diaphoresis, appetite change, fatigue and unexpected weight change.  HENT: Negative for  mouth sores.   Eyes: Negative for visual disturbance.  Respiratory: Negative for cough, chest tightness, shortness of breath and wheezing.   Cardiovascular: Positive for chest pain.  Gastrointestinal: Negative for nausea, vomiting, abdominal pain, diarrhea and constipation.  Endocrine: Negative for polydipsia, polyphagia and polyuria.  Genitourinary: Negative for dysuria, urgency, frequency and hematuria.  Musculoskeletal: Positive for arthralgias, back pain (low back pain), joint swelling and myalgias. Negative for neck stiffness.  Skin: Negative for rash.  Allergic/Immunologic: Negative for immunocompromised state.  Neurological: Negative for syncope, light-headedness and headaches.  Hematological: Does not bruise/bleed easily.  Psychiatric/Behavioral: Negative for sleep disturbance. The patient is not nervous/anxious.     Allergies  Banana; Other; Morphine and related; and Adhesive  Home Medications   Current Outpatient Rx  Name  Route  Sig  Dispense  Refill  . aspirin EC 81 MG tablet   Oral   Take 81 mg by mouth daily.         . folic acid (FOLVITE) 1 MG tablet   Oral   Take 1 tablet (1 mg total) by mouth daily. Take 1 tablet (1 mg total) by mouth daily.   30 tablet   11   . promethazine (PHENERGAN) 25 MG tablet   Oral   Take 25 mg by mouth every 6 (six) hours as needed for nausea.         Marland Kitchen elvitegravir-cobicistat-emtricitabine-tenofovir (  STRIBILD) 150-150-200-300 MG TABS tablet   Oral   Take 1 tablet by mouth daily.          BP 142/93  Pulse 82  Temp(Src) 98.1 F (36.7 C) (Oral)  Resp 18  SpO2 95% Physical Exam  Nursing note and vitals reviewed. Constitutional: He appears well-developed and well-nourished. No distress.  Awake, alert, nontoxic appearance  HENT:  Head: Normocephalic and atraumatic.  Mouth/Throat: Oropharynx is clear and moist. No oropharyngeal exudate.  Eyes: Conjunctivae are normal. Pupils are equal, round, and reactive to light. No  scleral icterus.  Neck: Normal range of motion. Neck supple.  Cardiovascular: Normal rate, regular rhythm, S1 normal, S2 normal, normal heart sounds and intact distal pulses.   No murmur heard. Pulses:      Radial pulses are 2+ on the right side, and 2+ on the left side.       Dorsalis pedis pulses are 2+ on the right side, and 2+ on the left side.       Posterior tibial pulses are 2+ on the right side, and 2+ on the left side.  Capillary refill < 3 sec in the bilateral lower extremities 2+ pulses in the bilateral lower extremities  Pulmonary/Chest: Effort normal and breath sounds normal. No respiratory distress. He has no wheezes. He has no rales. He exhibits no tenderness.  Clear and equal breath sounds  Abdominal: Soft. Bowel sounds are normal. He exhibits no distension and no mass. There is no tenderness. There is no rebound and no guarding.  Musculoskeletal: He exhibits no edema.       Right ankle: He exhibits decreased range of motion and swelling. He exhibits no ecchymosis, no deformity, no laceration and normal pulse. Tenderness (generalized).  Generalized swelling and pain to the right ankle No swelling or pain to palpation of the right calf No palpable cord No pitting edema No erythema or increased warmth  Lymphadenopathy:    He has no cervical adenopathy.  Neurological: He is alert.  Speech is clear and goal oriented Moves extremities without ataxia  Skin: Skin is warm and dry. He is not diaphoretic.  Weeping lesions noted to the lower bilateral leg - pt reports these are flea bites and have been present for several weeks  Psychiatric: He has a normal mood and affect. His behavior is normal.    ED Course  Procedures (including critical care time) Labs Review Labs Reviewed  CBC WITH DIFFERENTIAL - Abnormal; Notable for the following:    RBC 2.31 (*)    Hemoglobin 7.3 (*)    HCT 20.3 (*)    RDW 22.1 (*)    Neutrophils Relative % 29 (*)    Lymphocytes Relative 57 (*)     Lymphs Abs 5.9 (*)    All other components within normal limits  COMPREHENSIVE METABOLIC PANEL - Abnormal; Notable for the following:    BUN 5 (*)    Total Protein 10.3 (*)    AST 67 (*)    Total Bilirubin 6.8 (*)    All other components within normal limits  RETICULOCYTES - Abnormal; Notable for the following:    Retic Ct Pct 23.6 (*)    RBC. 2.31 (*)    Retic Count, Manual 545.2 (*)    All other components within normal limits  D-DIMER, QUANTITATIVE - Abnormal; Notable for the following:    D-Dimer, Quant 1.30 (*)    All other components within normal limits  TROPONIN I   Imaging Review Dg Chest 2  View  08/25/2013   CLINICAL DATA:  Chest pain today, history sickle cell anemia, HIV, stroke  EXAM: CHEST  2 VIEW  COMPARISON:  03/08/2013  FINDINGS: Enlargement of cardiac silhouette with pulmonary vascular congestion.  Mediastinal contours normal.  Lungs clear.  No pleural effusion or pneumothorax.  Osseous structures unremarkable.  IMPRESSION: Enlargement of cardiac silhouette with pulmonary vascular congestion consistent with history of sickle cell disease.  No acute infiltrate.   Electronically Signed   By: Ulyses Southward M.D.   On: 08/25/2013 20:18   Ct Angio Chest Pe W/cm &/or Wo Cm  08/25/2013   CLINICAL DATA:  Shortness of breath; sickle cell crisis. Evaluate for pulmonary embolus.  EXAM: CT ANGIOGRAPHY CHEST WITH CONTRAST  TECHNIQUE: Multidetector CT imaging of the chest was performed using the standard protocol during bolus administration of intravenous contrast. Multiplanar CT image reconstructions including MIPs were obtained to evaluate the vascular anatomy.  CONTRAST:  OMNIPAQUE IOHEXOL 350 MG/ML SOLN  COMPARISON:  Chest radiograph performed earlier today at 08:12 p.m.  FINDINGS: There is no evidence of pulmonary embolus.  Minimal left basilar atelectasis is noted. There is mild diffuse prominence of the visualized pulmonary vasculature, compatible with vascular congestion.  There is no evidence of significant focal consolidation, pleural effusion or pneumothorax. No masses are identified; no abnormal focal contrast enhancement is seen.  The mediastinum is unremarkable in appearance. No mediastinal lymphadenopathy is seen. No pericardial effusion is identified. The great vessels are grossly unremarkable in appearance.  Enlarged bilateral axillary nodes are seen, measuring up to 3.2 cm in short axis; these are of uncertain significance. The thyroid gland is unremarkable in appearance.  The visualized portions of the liver are unremarkable. The spleen is markedly atrophic.  No acute osseous abnormalities are seen.  Review of the MIP images confirms the above findings.  IMPRESSION: 1. No evidence of pulmonary embolus. 2. Minimal left basilar atelectasis noted. Underlying vascular congestion noted. Lungs otherwise clear. 3. Significantly enlarged bilateral axillary nodes, measuring up to 3.2 cm in short axis. These are of uncertain significance. They are close to the skin surface and would be amenable to biopsy, as deemed clinically appropriate.   Electronically Signed   By: Roanna Raider M.D.   On: 08/25/2013 23:47    EKG Interpretation   None       Date: 08/26/2013  Rate: 70  Rhythm: normal sinus rhythm  QRS Axis: normal  Intervals: normal  ST/T Wave abnormalities: nonspecific ST changes  Conduction Disutrbances:none  Narrative Interpretation: RSR' in V1 or V2, nonspecific T wave abnormality unchanged from previous in 04/2013  Old EKG Reviewed: unchanged    MDM   1. Sickle cell pain crisis   2. HIV (human immunodeficiency virus infection)      Jeremy Mclaughlin presents with typical sickle cell crisis with associated swelling of the right ankle.    10:15PM Pt with elevated d-dimer and persistent chest pain.  Will obtain CT angio.  Patient with anemia at 7.3, down from 8.1 in November however not significantly off baseline.  Elevated reticulocyte count 23.6 and  troponin negative. Lesions on legs are concerning, but no evidence of cellulitis or abscess.  Doubt septic joint and patient has remained afebrile.    12:09 AM  Pt with persistent pain unable to be controlled after 8mg  Dilaudid.  Will proceed with admission.  CT angio without evidence of PE.  Will likely need venous duplex to rule out DVT.  Pt reports he is a patient of  Marthann Schiller and the sickle cell clinic though her note from September is unclear who is managing his case.  Pt remains hemodynamically stable.   BP 142/93  Pulse 82  Temp(Src) 98.1 F (36.7 C) (Oral)  Resp 18  SpO2 95%    Dierdre Forth, PA-C 08/26/13 0045

## 2013-08-25 NOTE — ED Notes (Signed)
Pt reports right and left foot swelling that began 15 minutes prior to arrival. Pt reports the right is worse than the left. Pt also reports back pain as well as a headache. Pt reports nausea.

## 2013-08-26 ENCOUNTER — Encounter (HOSPITAL_COMMUNITY): Payer: Self-pay | Admitting: Internal Medicine

## 2013-08-26 DIAGNOSIS — D649 Anemia, unspecified: Secondary | ICD-10-CM

## 2013-08-26 DIAGNOSIS — D72829 Elevated white blood cell count, unspecified: Secondary | ICD-10-CM

## 2013-08-26 DIAGNOSIS — Z8679 Personal history of other diseases of the circulatory system: Secondary | ICD-10-CM

## 2013-08-26 DIAGNOSIS — M79609 Pain in unspecified limb: Secondary | ICD-10-CM

## 2013-08-26 DIAGNOSIS — D5700 Hb-SS disease with crisis, unspecified: Secondary | ICD-10-CM | POA: Diagnosis present

## 2013-08-26 DIAGNOSIS — I675 Moyamoya disease: Secondary | ICD-10-CM

## 2013-08-26 DIAGNOSIS — Z21 Asymptomatic human immunodeficiency virus [HIV] infection status: Secondary | ICD-10-CM

## 2013-08-26 DIAGNOSIS — R17 Unspecified jaundice: Secondary | ICD-10-CM

## 2013-08-26 LAB — CBC WITH DIFFERENTIAL/PLATELET
Basophils Absolute: 0.1 10*3/uL (ref 0.0–0.1)
Basophils Relative: 1 % (ref 0–1)
Eosinophils Relative: 1 % (ref 0–5)
HCT: 19.5 % — ABNORMAL LOW (ref 39.0–52.0)
Hemoglobin: 6.8 g/dL — CL (ref 13.0–17.0)
Lymphocytes Relative: 44 % (ref 12–46)
Lymphs Abs: 6.3 10*3/uL — ABNORMAL HIGH (ref 0.7–4.0)
MCH: 30.4 pg (ref 26.0–34.0)
MCV: 87.1 fL (ref 78.0–100.0)
Monocytes Relative: 8 % (ref 3–12)
Neutro Abs: 6.7 10*3/uL (ref 1.7–7.7)
RBC: 2.24 MIL/uL — ABNORMAL LOW (ref 4.22–5.81)
WBC: 14.4 10*3/uL — ABNORMAL HIGH (ref 4.0–10.5)

## 2013-08-26 LAB — COMPREHENSIVE METABOLIC PANEL
AST: 62 U/L — ABNORMAL HIGH (ref 0–37)
Albumin: 3.4 g/dL — ABNORMAL LOW (ref 3.5–5.2)
BUN: 3 mg/dL — ABNORMAL LOW (ref 6–23)
CO2: 26 mEq/L (ref 19–32)
Calcium: 8.9 mg/dL (ref 8.4–10.5)
Chloride: 104 mEq/L (ref 96–112)
Creatinine, Ser: 0.45 mg/dL — ABNORMAL LOW (ref 0.50–1.35)
GFR calc non Af Amer: >90 mL/min (ref >90)
Glucose, Bld: 90 mg/dL (ref 70–99)
Total Protein: 9.7 g/dL — ABNORMAL HIGH (ref 6.0–8.3)

## 2013-08-26 LAB — MAGNESIUM: Magnesium: 1.7 mg/dL (ref 1.5–2.5)

## 2013-08-26 MED ORDER — KETOROLAC TROMETHAMINE 30 MG/ML IJ SOLN
30.0000 mg | Freq: Four times a day (QID) | INTRAMUSCULAR | Status: AC
  Administered 2013-08-26 – 2013-08-30 (×20): 30 mg via INTRAVENOUS
  Filled 2013-08-26 (×25): qty 1

## 2013-08-26 MED ORDER — ACETAMINOPHEN 650 MG RE SUPP: 650.0000 mg | RECTAL | Status: DC | PRN

## 2013-08-26 MED ORDER — DIPHENHYDRAMINE HCL 12.5 MG/5ML PO ELIX: 12.5000 mg | ORAL_SOLUTION | Freq: Four times a day (QID) | ORAL | Status: DC | PRN

## 2013-08-26 MED ORDER — DIPHENHYDRAMINE HCL 50 MG/ML IJ SOLN
12.5000 mg | Freq: Four times a day (QID) | INTRAMUSCULAR | Status: DC | PRN
  Administered 2013-08-26: 12.5 mg via INTRAVENOUS
  Filled 2013-08-26: qty 1

## 2013-08-26 MED ORDER — ONDANSETRON HCL 4 MG/2ML IJ SOLN: 4.0000 mg | Freq: Three times a day (TID) | INTRAMUSCULAR | Status: DC | PRN

## 2013-08-26 MED ORDER — ONDANSETRON HCL 4 MG/2ML IJ SOLN
4.0000 mg | Freq: Four times a day (QID) | INTRAMUSCULAR | Status: DC | PRN
  Administered 2013-08-26 – 2013-08-27 (×3): 4 mg via INTRAVENOUS
  Filled 2013-08-26 (×3): qty 2

## 2013-08-26 MED ORDER — HYDROMORPHONE 0.3 MG/ML IV SOLN
INTRAVENOUS | Status: DC
  Administered 2013-08-26: 02:00:00 via INTRAVENOUS
  Administered 2013-08-26: 1.5 mg via INTRAVENOUS
  Administered 2013-08-26: 0.45 mg via INTRAVENOUS
  Administered 2013-08-26: 0.6 mg via INTRAVENOUS
  Administered 2013-08-26: 2.4 mg via INTRAVENOUS
  Administered 2013-08-26: 0.6 mg via INTRAVENOUS
  Administered 2013-08-26: 2.1 mg via INTRAVENOUS
  Administered 2013-08-27: 1.2 mg via INTRAVENOUS
  Administered 2013-08-27: 1.5 mg via INTRAVENOUS
  Filled 2013-08-26 (×2): qty 25

## 2013-08-26 MED ORDER — PROMETHAZINE HCL 25 MG PO TABS
25.0000 mg | ORAL_TABLET | Freq: Four times a day (QID) | ORAL | Status: DC | PRN
  Administered 2013-08-26 – 2013-08-31 (×10): 25 mg via ORAL
  Filled 2013-08-26 (×11): qty 1

## 2013-08-26 MED ORDER — FOLIC ACID 1 MG PO TABS
1.0000 mg | ORAL_TABLET | Freq: Every day | ORAL | Status: DC
  Administered 2013-08-26 – 2013-08-31 (×6): 1 mg via ORAL
  Filled 2013-08-26 (×6): qty 1

## 2013-08-26 MED ORDER — DOCUSATE SODIUM 100 MG PO CAPS
100.0000 mg | ORAL_CAPSULE | Freq: Two times a day (BID) | ORAL | Status: DC
  Administered 2013-08-26 (×2): 100 mg via ORAL
  Filled 2013-08-26 (×4): qty 1

## 2013-08-26 MED ORDER — SODIUM CHLORIDE 0.9 % IJ SOLN: 9.0000 mL | INTRAMUSCULAR | Status: DC | PRN

## 2013-08-26 MED ORDER — ACETAMINOPHEN 325 MG PO TABS: 650.0000 mg | ORAL_TABLET | ORAL | Status: DC | PRN

## 2013-08-26 MED ORDER — ENOXAPARIN SODIUM 40 MG/0.4ML ~~LOC~~ SOLN
40.0000 mg | SUBCUTANEOUS | Status: DC
  Filled 2013-08-26 (×3): qty 0.4

## 2013-08-26 MED ORDER — ASPIRIN EC 81 MG PO TBEC
81.0000 mg | DELAYED_RELEASE_TABLET | Freq: Every day | ORAL | Status: DC
  Administered 2013-08-26 – 2013-08-31 (×6): 81 mg via ORAL
  Filled 2013-08-26 (×6): qty 1

## 2013-08-26 MED ORDER — SODIUM CHLORIDE 0.45 % IV SOLN
INTRAVENOUS | Status: DC
  Administered 2013-08-26 – 2013-08-28 (×7): via INTRAVENOUS
  Administered 2013-08-28: 100 mL/h via INTRAVENOUS
  Administered 2013-08-29 – 2013-08-30 (×2): via INTRAVENOUS

## 2013-08-26 MED ORDER — NALOXONE HCL 0.4 MG/ML IJ SOLN: 0.4000 mg | INTRAMUSCULAR | Status: DC | PRN

## 2013-08-26 NOTE — Progress Notes (Signed)
[facility] PROGRESS NOTE  Jeremy Mclaughlin VWU:[id number] DOB: 1985-09-29 DOA: 08/25/2013 PCP: MATTHEWS,MICHELLE A., MD  Assessment/Plan: Hemoglobin SS disease with vaso-occlusive crisis  - Admit to medical floor for management and pain control.  - Treat with hypotonic IV fluids, IV Toradol, folate and Dilaudid PCA - Monitor CBCs closely.  Will transfuse 1 unit PRBC to goal Hg of ~8 given history of Moya Moya syndrome. Repeat 1 unit PRBC if needed tomorrow.  - His chest pain is likely part of his painful crisis and less likely to be acute chest syndrome in the absence of respiratory symptoms, fever or acute findings on imaging.  - Right ankle swelling/pain, also probably due to his painful crisis. right lower extremity venous Doppler negative for DVT.  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 He reports history of frequent transfusions.  - Follow CBC, transfusion as above - Check hemoglobin electrophoresis - Pt did not follow up at the sickle cell center after his visit in Sept but is reporting today that he does plan to follow up with the sickle cell center.  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   Hyperbilirubinemia and mild transaminitis  - Secondary to hemolytic anemia  - follow labs  Code Status: Full  Family Communication: discussed with patient's mother and stepfather at bedside after patient's consent.  Disposition Plan: home when medically stable.    HPI/Subjective: Pt reports that he is having persistent right ankle pain and not able to stand on right foot at this time, he has also reported some nausea and occasional emesis.   Objective: Filed Vitals:   08/26/13  1200  BP:   Pulse:   Temp:   Resp: 16    Intake/Output Summary (Last 24 hours) at 08/26/13 1231 Last data filed at 08/26/13 0700  Gross per 24 hour  Intake      0 ml  Output   1400 ml  Net  -1400 ml   Filed Weights   08/26/13 0106 08/26/13 0145  Weight: 138 lb 7.2 oz (62.8 kg) 138 lb 7.2 oz (62.8 kg)   Exam:   General:  Awake, alert, no distress, cooperative  Cardiovascular: normal s1, s2 sounds no MRG  Respiratory: BBS clear to auscultation  Abdomen: soft, nondistended, nontender, no masses palpated  Musculoskeletal: mild edema right ankle, and large healed scar over right ankle noted   Data Reviewed: Basic Metabolic Panel:  Recent Labs Lab 08/25/13 1931 08/26/13 0535  NA 135 135  K 4.0 4.0  CL 103 104  CO2 26 26  GLUCOSE 95 90  BUN 5* 3*  CREATININE 0.55 0.45*  CALCIUM 9.0 8.9  MG  --  1.7   Liver Function Tests:  Recent Labs Lab 08/25/13 1931 08/26/13 0535  AST 67* 62*  ALT 30 31  ALKPHOS 91 89  BILITOT 6.8* 6.3*  PROT 10.3* 9.7*  ALBUMIN 3.6 3.4*   No results found for this basename: LIPASE, AMYLASE,  in the last 168 hours No results found for this basename: AMMONIA,  in the last 168 hours CBC:  Recent Labs Lab 08/25/13 1931 08/26/13 0535  WBC 10.3 14.4*  NEUTROABS 3.0 6.7  HGB 7.3* 6.8*  HCT  20.3* 19.5*  MCV 87.9 87.1  PLT 316 294   Cardiac Enzymes:  Recent Labs Lab 08/25/13 1931  TROPONINI <0.30   BNP (last 3 results) No results found for this basename: PROBNP,  in the last 8760 hours CBG: No results found for this basename: GLUCAP,  in the last 168 hours  No results found for this or any previous visit (from the past 240 hour(s)).   Studies: Dg Chest 2 View  08/25/2013   CLINICAL DATA:  Chest pain today, history sickle cell anemia, HIV, stroke  EXAM: CHEST  2 VIEW  COMPARISON:  03/08/2013  FINDINGS: Enlargement of cardiac silhouette with pulmonary vascular congestion.  Mediastinal contours normal.  Lungs clear.  No  pleural effusion or pneumothorax.  Osseous structures unremarkable.  IMPRESSION: Enlargement of cardiac silhouette with pulmonary vascular congestion consistent with history of sickle cell disease.  No acute infiltrate.   Electronically Signed   By: Ulyses Southward M.D.   On: 08/25/2013 20:18   Ct Angio Chest Pe W/cm &/or Wo Cm  08/25/2013   CLINICAL DATA:  Shortness of breath; sickle cell crisis. Evaluate for pulmonary embolus.  EXAM: CT ANGIOGRAPHY CHEST WITH CONTRAST  TECHNIQUE: Multidetector CT imaging of the chest was performed using the standard protocol during bolus administration of intravenous contrast. Multiplanar CT image reconstructions including MIPs were obtained to evaluate the vascular anatomy.  CONTRAST:  OMNIPAQUE IOHEXOL 350 MG/ML SOLN  COMPARISON:  Chest radiograph performed earlier today at 08:12 p.m.  FINDINGS: There is no evidence of pulmonary embolus.  Minimal left basilar atelectasis is noted. There is mild diffuse prominence of the visualized pulmonary vasculature, compatible with vascular congestion. There is no evidence of significant focal consolidation, pleural effusion or pneumothorax. No masses are identified; no abnormal focal contrast enhancement is seen.  The mediastinum is unremarkable in appearance. No mediastinal lymphadenopathy is seen. No pericardial effusion is identified. The great vessels are grossly unremarkable in appearance.  Enlarged bilateral axillary nodes are seen, measuring up to 3.2 cm in short axis; these are of uncertain significance. The thyroid gland is unremarkable in appearance.  The visualized portions of the liver are unremarkable. The spleen is markedly atrophic.  No acute osseous abnormalities are seen.  Review of the MIP images confirms the above findings.  IMPRESSION: 1. No evidence of pulmonary embolus. 2. Minimal left basilar atelectasis noted. Underlying vascular congestion noted. Lungs otherwise clear. 3. Significantly enlarged bilateral  axillary nodes, measuring up to 3.2 cm in short axis. These are of uncertain significance. They are close to the skin surface and would be amenable to biopsy, as deemed clinically appropriate.   Electronically Signed   By: Roanna Raider M.D.   On: 08/25/2013 23:47    Scheduled Meds: . aspirin EC  81 mg Oral Daily  . docusate sodium  100 mg Oral BID  . enoxaparin (LOVENOX) injection  40 mg Subcutaneous Q24H  . folic acid  1 mg Oral Daily  . HYDROmorphone PCA 0.3 mg/mL   Intravenous Q4H  . ketorolac  30 mg Intravenous Q6H   Continuous Infusions: . sodium chloride 150 mL/hr at 08/26/13 1012   Principal Problem:   Sickle cell pain crisis Active Problems:   History of Moyamoya syndrome   HIV (human immunodeficiency virus infection)   Anemia   Hyperbilirubinemia   Sickle cell anemia with crisis   Sickle cell crisis  Standley Dakins MD  [facility] Pager [phone]. If 7PM-7AM, please contact night-coverage at [email or web address], password [facility] 08/26/2013, 12:31  PM  LOS: 1 day

## 2013-08-26 NOTE — Progress Notes (Signed)
UR completed 

## 2013-08-26 NOTE — Progress Notes (Signed)
*  Preliminary Results* Right lower extremity venous duplex completed. Right lower extremity is negative for deep vein thrombosis. There is no evidence of right Baker's cyst. There is evidence of an enlarged right inguinal lymph node.  08/26/2013 11:35 AM  Gertie Fey, RVT, RDCS, RDMS

## 2013-08-26 NOTE — H&P (Signed)
[facility]  History and Physical  Jeremy Mclaughlin UVO:[id number] DOB: 1986-02-20 DOA: 08/25/2013  Referring physician: EDP PCP: No PCP Per Patient  Outpatient Specialists:  1. Infectious Disease: Dr. Staci Righter  Chief Complaint: Right ankle, right lower extremity, low back, chest pain, headache.   HPI: Jeremy Mclaughlin is a 27 y.o. 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 He states that this presentation is similar to his prior episodes of sickle cell crisis. He apparently was in his usual state of health until 6 PM on 08/25/13 when after he got out of the shower and subacutely experienced pain in his right ankle with associated swelling and over the next short duration he experienced pain across the entire right lower extremity, lower back and chest pain. He rates the pain between 7-9/10 in severity, sharp. He also complains of headache and nausea. He vomited twice which was bilious without coffee-ground or blood. He denies fevers, chills, difficulty breathing or cough. His chest pains are made worse by deep breathing or movement. In the ED, AST 67, total bilirubin 6.8, total protein 10.3, albumin 3.6, hemoglobin 7.3, d-dimer was positive which was followed by CT a chest which showed no evidence of PE or acute findings. Patient has received Dilaudid 2 mg x3 doses over the last couple of hours despite which he continues to have significant amount of pain. Hospitalist admission requested.   Review of Systems: All systems reviewed and apart from history of presenting illness, are negative.  Past Medical History  Diagnosis Date  . Sickle cell anemia   . Immune deficiency disorder   . HIV (human immunodeficiency virus infection)   .  Shingles   . Stroke    Past Surgical History  Procedure Laterality Date  . Brain surgery    . Cholecystectomy     Social History:  reports that he has been smoking Cigarettes.  He has been smoking about 0.00 packs per day. He has never used smokeless tobacco. He reports that he drinks alcohol. He reports that he does not use illicit drugs.  Single. Independent of activities of daily living.  Allergies  Allergen Reactions  . Banana Anaphylaxis  . Other Anaphylaxis and Rash    melons  . Morphine And Related Hives and Itching    Doesn't work for pt  . Adhesive [Tape] Rash    Family History  Problem Relation Age of Onset  . Heart disease Father   . Diabetes Father   . Hypertension Maternal Aunt   . Diabetes Maternal Aunt   . Cancer Maternal Grandmother   . Heart disease Mother   . Diabetes Mother     Prior to Admission medications   Medication Sig Start Date End Date Taking? Authorizing Provider  aspirin EC 81 MG tablet Take 81 mg by mouth daily.   Yes Historical Provider, MD  folic acid (FOLVITE) 1 MG tablet Take 1 tablet (1 mg total) by mouth daily. Take 1 tablet (1 mg total) by mouth daily. 05/23/13  Yes Altha Harm, MD  promethazine (PHENERGAN) 25 MG tablet Take 25 mg by mouth every 6 (six) hours as needed for nausea. 05/18/13  Yes Alison Murray, MD  elvitegravir-cobicistat-emtricitabine-tenofovir (STRIBILD) 150-150-200-300 MG TABS tablet Take 1 tablet by mouth daily. 07/26/13   Belia Heman  Comer, MD   Physical Exam: Filed Vitals:   08/25/13 1854  BP: 142/93  Pulse: 82  Temp: 98.1 F (36.7 C)  TempSrc: Oral  Resp: 18  SpO2: 95%     General exam: Moderately built and nourished male patient, lying  on the gurney with intermittent painful distress and nausea.  Head, eyes and ENT: Nontraumatic and normocephalic. Pupils equally reacting to light and accommodation. Oral mucosa  mildly dry. Mild scleral icterus.   Neck: Supple. No JVD, carotid bruit or  thyromegaly.  Lymphatics: No lymphadenopathy.  Respiratory system: Clear to auscultation. No increased work of breathing. Reproducible chest wall tenderness. Shingles scars on right lower anterior rib cage.   Cardiovascular system: S1 and S2 heard, RRR. No JVD, murmurs, gallops, clicks or pedal edema.  Gastrointestinal system: Abdomen is nondistended, soft and nontender. Normal bowel sounds heard. No organomegaly or masses appreciated.  Central nervous system: Alert and oriented. No focal neurological deficits.  Extremities: Symmetric 5 x 5 power. Peripheral pulses symmetrically felt. Right lower leg and ankle mildly swollen (apparently has chronic swelling due to prior stroke but a little worse than usual), mildly tender and painful range of motion but no other acute signs such as increased warmth or redness. Patient has multiple scars of healed ulcers or almost healed ulcers on the right lower leg.   Skin: No other rashes or acute findings.  Musculoskeletal system: Negative exam.  Psychiatry: Pleasant and cooperative.   Labs on Admission:  Basic Metabolic Panel:  Recent Labs Lab 08/25/13 1931  NA 135  K 4.0  CL 103  CO2 26  GLUCOSE 95  BUN 5*  CREATININE 0.55  CALCIUM 9.0   Liver Function Tests:  Recent Labs Lab 08/25/13 1931  AST 67*  ALT 30  ALKPHOS 91  BILITOT 6.8*  PROT 10.3*  ALBUMIN 3.6   No results found for this basename: LIPASE, AMYLASE,  in the last 168 hours No results found for this basename: AMMONIA,  in the last 168 hours CBC:  Recent Labs Lab 08/25/13 1931  WBC 10.3  NEUTROABS 3.0  HGB 7.3*  HCT 20.3*  MCV 87.9  PLT 316   Cardiac Enzymes:  Recent Labs Lab 08/25/13 1931  TROPONINI <0.30    BNP (last 3 results) No results found for this basename: PROBNP,  in the last 8760 hours CBG: No results found for this basename: GLUCAP,  in the last 168 hours  Radiological Exams on Admission: Dg Chest 2 View  08/25/2013   CLINICAL  DATA:  Chest pain today, history sickle cell anemia, HIV, stroke  EXAM: CHEST  2 VIEW  COMPARISON:  03/08/2013  FINDINGS: Enlargement of cardiac silhouette with pulmonary vascular congestion.  Mediastinal contours normal.  Lungs clear.  No pleural effusion or pneumothorax.  Osseous structures unremarkable.  IMPRESSION: Enlargement of cardiac silhouette with pulmonary vascular congestion consistent with history of sickle cell disease.  No acute infiltrate.   Electronically Signed   By: Ulyses Southward M.D.   On: 08/25/2013 20:18   Ct Angio Chest Pe W/cm &/or Wo Cm  08/25/2013   CLINICAL DATA:  Shortness of breath; sickle cell crisis. Evaluate for pulmonary embolus.  EXAM: CT ANGIOGRAPHY CHEST WITH CONTRAST  TECHNIQUE: Multidetector CT imaging of the chest was performed using the standard protocol during bolus administration of intravenous contrast. Multiplanar CT image reconstructions including MIPs were obtained to evaluate the vascular anatomy.  CONTRAST:  OMNIPAQUE IOHEXOL 350 MG/ML SOLN  COMPARISON:  Chest radiograph performed earlier  today at 08:12 p.m.  FINDINGS: There is no evidence of pulmonary embolus.  Minimal left basilar atelectasis is noted. There is mild diffuse prominence of the visualized pulmonary vasculature, compatible with vascular congestion. There is no evidence of significant focal consolidation, pleural effusion or pneumothorax. No masses are identified; no abnormal focal contrast enhancement is seen.  The mediastinum is unremarkable in appearance. No mediastinal lymphadenopathy is seen. No pericardial effusion is identified. The great vessels are grossly unremarkable in appearance.  Enlarged bilateral axillary nodes are seen, measuring up to 3.2 cm in short axis; these are of uncertain significance. The thyroid gland is unremarkable in appearance.  The visualized portions of the liver are unremarkable. The spleen is markedly atrophic.  No acute osseous abnormalities are seen.  Review of  the MIP images confirms the above findings.  IMPRESSION: 1. No evidence of pulmonary embolus. 2. Minimal left basilar atelectasis noted. Underlying vascular congestion noted. Lungs otherwise clear. 3. Significantly enlarged bilateral axillary nodes, measuring up to 3.2 cm in short axis. These are of uncertain significance. They are close to the skin surface and would be amenable to biopsy, as deemed clinically appropriate.   Electronically Signed   By: Roanna Raider M.D.   On: 08/25/2013 23:47    EKG: Independently reviewed.  sinus rhythm, normal axis, probable LVH, nonspecific ST-T changes and no acute findings.  Assessment/Plan Principal Problem:   Sickle cell pain crisis Active Problems:   History of Moyamoya syndrome   HIV (human immunodeficiency virus infection)   Anemia   Hyperbilirubinemia   Hemoglobin SS disease with vaso-occlusive crisis - Admit to medical floor for observation and management. - Treat with hypotonic IV fluids, IV Toradol, folate and Dilaudid PCA (as per review of previous records, he has required high doses of Dilaudid in the past)-if the high dose PCA does not work, this may have to be customized for him. - Monitor CBCs closely. - His chest pain is likely part of his painful crisis and less likely to be acute chest syndrome in the absence of respiratory symptoms, fever or acute findings on imaging. - Right ankle swelling/pain, also probably due to his painful crisis. We'll however obtain right lower extremity venous Doppler to rule out DVT. - Sickle cell M.D. to assume care later today.  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 He apparently requires frequent transfusions. - Follow CBC in a.m. and transfuse if hemoglobin less than 7 g per DL.  HIV - Patient apparently has not started his HAART therapy-states that the prescriptions or not yet ready to be picked up.  History of stroke secondary  to Moya- Moya syndrome - Continue aspirin for secondary stroke prophylaxis.  Hyperbilirubinemia and mild transaminitis - Secondary to hemolytic anemia    Code Status:  Full  Family Communication:  discussed with patient's mother and stepfather at bedside after patient's consent.  Disposition Plan:  home when medically stable.   Time spent:  65 minutes  Jeremy Kleve, MD, FACP, FHM. [facility] Pager [phone]  If 7PM-7AM, please contact night-coverage [email or web address] Password [facility] 08/26/2013, 12:59 AM

## 2013-08-26 NOTE — Progress Notes (Signed)
Patient ID: Jeremy Mclaughlin, male   DOB: November 12, 1985, 27 y.o.   MRN: [id number] Pt has been seen in the clinic once. He has never followed up for requested labs or with Hematologist at [facility]. Per review of records from [facility], pt usually ahs a baseline Hb of 8.5. Pt has a h/o CVA and Moya Moya which makes it essential that his Hb be maintained at 8.5, and Hb S % in 30-40% range. Pt should follow up after hospitalization as we are unable to provide care unless he follows up with appointments. Will refer to [facility] for assistance with barriers to following up with his care.

## 2013-08-27 LAB — COMPREHENSIVE METABOLIC PANEL WITH GFR
ALT: 31 U/L (ref 0–53)
AST: 64 U/L — ABNORMAL HIGH (ref 0–37)
Albumin: 3.2 g/dL — ABNORMAL LOW (ref 3.5–5.2)
Alkaline Phosphatase: 106 U/L (ref 39–117)
BUN: 5 mg/dL — ABNORMAL LOW (ref 6–23)
CO2: 26 meq/L (ref 19–32)
Calcium: 8.6 mg/dL (ref 8.4–10.5)
Chloride: 99 meq/L (ref 96–112)
Creatinine, Ser: 0.53 mg/dL (ref 0.50–1.35)
GFR calc Af Amer: >90 mL/min (ref >90)
GFR calc non Af Amer: >90 mL/min (ref >90)
Glucose, Bld: 76 mg/dL (ref 70–99)
Potassium: 3.4 meq/L — ABNORMAL LOW (ref 3.5–5.1)
Sodium: 134 meq/L — ABNORMAL LOW (ref 135–145)
Total Bilirubin: 7.6 mg/dL — ABNORMAL HIGH (ref 0.3–1.2)
Total Protein: 9.3 g/dL — ABNORMAL HIGH (ref 6.0–8.3)

## 2013-08-27 LAB — CBC
HCT: 22.8 % — ABNORMAL LOW (ref 39.0–52.0)
Hemoglobin: 8.2 g/dL — ABNORMAL LOW (ref 13.0–17.0)
MCH: 31.3 pg (ref 26.0–34.0)
MCHC: 36 g/dL (ref 30.0–36.0)
MCV: 87 fL (ref 78.0–100.0)
Platelets: 294 K/uL (ref 150–400)
RBC: 2.62 MIL/uL — ABNORMAL LOW (ref 4.22–5.81)
RDW: 20.5 % — ABNORMAL HIGH (ref 11.5–15.5)
WBC: 13.1 K/uL — ABNORMAL HIGH (ref 4.0–10.5)

## 2013-08-27 MED ORDER — NALOXONE HCL 0.4 MG/ML IJ SOLN: 0.4000 mg | INTRAMUSCULAR | Status: DC | PRN

## 2013-08-27 MED ORDER — DIPHENHYDRAMINE HCL 50 MG/ML IJ SOLN
12.5000 mg | Freq: Four times a day (QID) | INTRAMUSCULAR | Status: DC | PRN
  Administered 2013-08-29 (×3): 12.5 mg via INTRAVENOUS
  Filled 2013-08-27 (×3): qty 1

## 2013-08-27 MED ORDER — SENNOSIDES-DOCUSATE SODIUM 8.6-50 MG PO TABS
1.0000 | ORAL_TABLET | Freq: Two times a day (BID) | ORAL | Status: DC
  Administered 2013-08-27 – 2013-08-31 (×8): 1 via ORAL
  Filled 2013-08-27 (×10): qty 1

## 2013-08-27 MED ORDER — SODIUM CHLORIDE 0.9 % IJ SOLN: 9.0000 mL | INTRAMUSCULAR | Status: DC | PRN

## 2013-08-27 MED ORDER — HYDROMORPHONE 0.3 MG/ML IV SOLN
INTRAVENOUS | Status: DC
  Administered 2013-08-27: 2.89 mg via INTRAVENOUS

## 2013-08-27 MED ORDER — HYDROMORPHONE 0.3 MG/ML IV SOLN
INTRAVENOUS | Status: DC
  Administered 2013-08-27: 15:00:00 via INTRAVENOUS
  Administered 2013-08-27: 2.39 mg via INTRAVENOUS
  Administered 2013-08-27: 2.64 mg via INTRAVENOUS
  Administered 2013-08-28: 2.34 mg via INTRAVENOUS
  Administered 2013-08-28: 2.8 mg via INTRAVENOUS
  Administered 2013-08-28: 1.6 mg via INTRAVENOUS
  Administered 2013-08-28: 2.39 mg via INTRAVENOUS
  Administered 2013-08-28: 1.777 mg via INTRAVENOUS
  Administered 2013-08-28: 1.9 mg via INTRAVENOUS
  Administered 2013-08-29: 1.2 mg via INTRAVENOUS
  Administered 2013-08-29: 19:00:00 via INTRAVENOUS
  Administered 2013-08-29: 3.59 mg via INTRAVENOUS
  Administered 2013-08-29: 10:00:00 via INTRAVENOUS
  Administered 2013-08-29: 2.7 mL via INTRAVENOUS
  Administered 2013-08-29: 5.99 mg via INTRAVENOUS
  Administered 2013-08-30: 2.71 mg via INTRAVENOUS
  Administered 2013-08-30: 2.72 mg via INTRAVENOUS
  Administered 2013-08-30: 4 mL via INTRAVENOUS
  Filled 2013-08-27 (×8): qty 25

## 2013-08-27 MED ORDER — POTASSIUM CHLORIDE CRYS ER 20 MEQ PO TBCR
20.0000 meq | EXTENDED_RELEASE_TABLET | Freq: Two times a day (BID) | ORAL | Status: AC
  Administered 2013-08-27 (×2): 20 meq via ORAL
  Filled 2013-08-27 (×2): qty 1

## 2013-08-27 MED ORDER — DIPHENHYDRAMINE HCL 12.5 MG/5ML PO ELIX: 12.5000 mg | ORAL_SOLUTION | Freq: Four times a day (QID) | ORAL | Status: DC | PRN

## 2013-08-27 MED ORDER — ONDANSETRON HCL 4 MG/2ML IJ SOLN
4.0000 mg | Freq: Four times a day (QID) | INTRAMUSCULAR | Status: DC | PRN
  Administered 2013-08-27 – 2013-08-30 (×8): 4 mg via INTRAVENOUS
  Filled 2013-08-27 (×8): qty 2

## 2013-08-27 NOTE — Progress Notes (Signed)
[facility] PROGRESS NOTE  Jeremy Mclaughlin ZOX:[id number] DOB: 12-Jun-1986 DOA: 08/25/2013 PCP: MATTHEWS,MICHELLE A., MD  Assessment/Plan: Hemoglobin SS disease with vaso-occlusive crisis  - Admited to medical floor for management and pain control.  - Continuing hypotonic IV fluids, IV Toradol, folate and Dilaudid PCA  - Monitor CBCs closely.  Transfuse 1 unit PRBC to goal Hg of ~8 given history of Moya Moya syndrome. Hg is 8.3 today - His chest pain is likely part of his painful crisis and less likely to be acute chest syndrome in the absence of respiratory symptoms, fever or acute findings on imaging.  - Right ankle swelling/pain, also probably due to his painful crisis. right lower extremity venous Doppler negative for DVT.  Pt is starting to ambulate in the room but still having significant pain and not able to bear weight on extremity.  - Adding oral phenergan to use as needed for nausea, zofran IV ordered  -Will make Dulude adjustments to PCA dose today for better pain control, goal to get pain to <7.   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 He reports history of frequent transfusions.  - Follow CBC, s/p 1 unit PRBC on 12/12.  - hemoglobin electrophoresis pending - Pt did not follow up at the sickle cell center after his visit in Sept but is reporting today that he does plan to follow up with the sickle cell center.   HIV  - Patient apparently has not started his HAART therapy- plans to follow up with ID oupatient   History of stroke secondary to MoyaMoya syndrome  - Continue aspirin for secondary stroke prophylaxis.   Hyperbilirubinemia and mild transaminitis  - Secondary to hemolytic anemia   Code Status: Full  Family Communication: discussed with patient's mother and stepfather at bedside after patient's consent.  Disposition Plan: home when medically stable. Not ready for discharge.     HPI/Subjective: Pt reports that overall he is improving but pain is not controlled, usually 8-9, lowest pain score is 7, pt has not been able to bear weight on right ankle but attempting to ambulate in room  Objective: Filed Vitals:   08/27/13 0742  BP:   Pulse:   Temp:   Resp: 16    Intake/Output Summary (Last 24 hours) at 08/27/13 0855 Last data filed at 08/27/13 0556  Gross per 24 hour  Intake   1940 ml  Output   1450 ml  Net    490 ml   Filed Weights   08/26/13 0106 08/26/13 0145  Weight: 138 lb 7.2 oz (62.8 kg) 138 lb 7.2 oz (62.8 kg)    Exam:  General: Awake, alert, no distress, cooperative  Cardiovascular: normal s1, s2 sounds no MRG  Respiratory: BBS clear to auscultation  Abdomen: soft, nondistended, nontender, no masses palpated  Musculoskeletal: mild edema right ankle, and large healed scar over right ankle noted   Data Reviewed: Basic Metabolic Panel:  Recent Labs Lab 08/25/13 1931 08/26/13 0535 08/27/13 0553  NA 135 135 134*  K 4.0 4.0 3.4*  CL 103 104 99  CO2 26 26 26   GLUCOSE 95 90 76  BUN 5* 3* 5*  CREATININE 0.55 0.45* 0.53  CALCIUM 9.0 8.9 8.6  MG  --  1.7  --    Liver Function Tests:  Recent Labs Lab 08/25/13 1931 08/26/13 0535 08/27/13 0553  AST 67* 62* 64*  ALT 30 31 31   ALKPHOS 91 89 106  BILITOT 6.8* 6.3* 7.6*  PROT 10.3* 9.7* 9.3*  ALBUMIN 3.6 3.4* 3.2*   No results found for this basename: LIPASE, AMYLASE,  in the last 168 hours No results found for this basename: AMMONIA,  in the last 168 hours CBC:  Recent Labs Lab 08/25/13 1931 08/26/13 0535 08/27/13 0553  WBC 10.3 14.4* 13.1*  NEUTROABS 3.0 6.7  --   HGB 7.3* 6.8* 8.2*  HCT 20.3* 19.5* 22.8*  MCV 87.9 87.1 87.0  PLT 316 294 294   Cardiac Enzymes:  Recent Labs Lab 08/25/13 1931  TROPONINI <0.30   BNP (last 3 results) No results found for this basename: PROBNP,  in the last 8760 hours CBG: No results found for this basename: GLUCAP,  in the last  168 hours  No results found for this or any previous visit (from the past 240 hour(s)).   Studies: Dg Chest 2 View  08/25/2013   CLINICAL DATA:  Chest pain today, history sickle cell anemia, HIV, stroke  EXAM: CHEST  2 VIEW  COMPARISON:  03/08/2013  FINDINGS: Enlargement of cardiac silhouette with pulmonary vascular congestion.  Mediastinal contours normal.  Lungs clear.  No pleural effusion or pneumothorax.  Osseous structures unremarkable.  IMPRESSION: Enlargement of cardiac silhouette with pulmonary vascular congestion consistent with history of sickle cell disease.  No acute infiltrate.   Electronically Signed   By: Ulyses Southward M.D.   On: 08/25/2013 20:18   Ct Angio Chest Pe W/cm &/or Wo Cm  08/25/2013   CLINICAL DATA:  Shortness of breath; sickle cell crisis. Evaluate for pulmonary embolus.  EXAM: CT ANGIOGRAPHY CHEST WITH CONTRAST  TECHNIQUE: Multidetector CT imaging of the chest was performed using the standard protocol during bolus administration of intravenous contrast. Multiplanar CT image reconstructions including MIPs were obtained to evaluate the vascular anatomy.  CONTRAST:  OMNIPAQUE IOHEXOL 350 MG/ML SOLN  COMPARISON:  Chest radiograph performed earlier today at 08:12 p.m.  FINDINGS: There is no evidence of pulmonary embolus.  Minimal left basilar atelectasis is noted. There is mild diffuse prominence of the visualized pulmonary vasculature, compatible with vascular congestion. There is no evidence of significant focal consolidation, pleural effusion or pneumothorax. No masses are identified; no abnormal focal contrast enhancement is seen.  The mediastinum is unremarkable in appearance. No mediastinal lymphadenopathy is seen. No pericardial effusion is identified. The great vessels are grossly unremarkable in appearance.  Enlarged bilateral axillary nodes are seen, measuring up to 3.2 cm in short axis; these are of uncertain significance. The thyroid gland is unremarkable in  appearance.  The visualized portions of the liver are unremarkable. The spleen is markedly atrophic.  No acute osseous abnormalities are seen.  Review of the MIP images confirms the above findings.  IMPRESSION: 1. No evidence of pulmonary embolus. 2. Minimal left basilar atelectasis noted. Underlying vascular congestion noted. Lungs otherwise clear. 3. Significantly enlarged bilateral axillary nodes, measuring up to 3.2 cm in short axis. These are of uncertain significance. They are close to the skin surface and would be amenable to biopsy, as deemed clinically appropriate.   Electronically Signed   By: Roanna Raider M.D.   On: 08/25/2013 23:47    Scheduled Meds: . aspirin EC  81 mg Oral Daily  . docusate sodium  100 mg Oral BID  . enoxaparin (LOVENOX) injection  40 mg Subcutaneous Q24H  . folic acid  1 mg Oral Daily  . HYDROmorphone PCA 0.3 mg/mL   Intravenous Q4H  . ketorolac  30 mg Intravenous  Q6H   Continuous Infusions: . sodium chloride 150 mL/hr at 08/27/13 0556    Principal Problem:   Sickle cell pain crisis Active Problems:   History of Moyamoya syndrome   HIV (human immunodeficiency virus infection)   Anemia   Hyperbilirubinemia   Sickle cell anemia with crisis   Sickle cell crisis   [facility]  [facility] Pager [phone]. If 7PM-7AM, please contact night-coverage at [email or web address], password [email or web address] 08/27/2013, 8:55 AM  LOS: 2 days   e

## 2013-08-28 DIAGNOSIS — L0100 Impetigo, unspecified: Secondary | ICD-10-CM | POA: Diagnosis not present

## 2013-08-28 DIAGNOSIS — R17 Unspecified jaundice: Secondary | ICD-10-CM | POA: Diagnosis present

## 2013-08-28 LAB — CBC
HCT: 22.7 % — ABNORMAL LOW (ref 39.0–52.0)
Hemoglobin: 8 g/dL — ABNORMAL LOW (ref 13.0–17.0)
MCH: 30.3 pg (ref 26.0–34.0)
MCHC: 35.2 g/dL (ref 30.0–36.0)
MCV: 86 fL (ref 78.0–100.0)
Platelets: 287 10*3/uL (ref 150–400)
RBC: 2.64 MIL/uL — ABNORMAL LOW (ref 4.22–5.81)

## 2013-08-28 MED ORDER — DOXYCYCLINE HYCLATE 100 MG PO TABS
100.0000 mg | ORAL_TABLET | Freq: Two times a day (BID) | ORAL | Status: DC
  Administered 2013-08-28 – 2013-08-31 (×7): 100 mg via ORAL
  Filled 2013-08-28 (×8): qty 1

## 2013-08-28 MED ORDER — SULFAMETHOXAZOLE-TMP DS 800-160 MG PO TABS
1.0000 | ORAL_TABLET | Freq: Two times a day (BID) | ORAL | Status: DC
Start: 2013-08-28 — End: 2013-08-28
  Filled 2013-08-28 (×2): qty 1

## 2013-08-28 MED ORDER — PANTOPRAZOLE SODIUM 40 MG PO TBEC
40.0000 mg | DELAYED_RELEASE_TABLET | Freq: Every day | ORAL | Status: DC
  Administered 2013-08-28 – 2013-08-31 (×3): 40 mg via ORAL
  Filled 2013-08-28 (×5): qty 1

## 2013-08-28 NOTE — Progress Notes (Addendum)
SICKLE CELL SERVICE PROGRESS NOTE  Jeremy Mclaughlin WUJ:[id number] DOB: [birth year] DOA: 08/25/2013 PCP: Jeremy A., MD  Assessment/Plan: Hemoglobin SS disease with vaso-occlusive crisis  - Admitted to medical floor for management and pain control.  - Continuing hypotonic IV fluids, IV Toradol, folate and Dilaudid PCA  - Monitor CBCs closely. Transfused 1 unit PRBC to goal Hg of 8 given history of Moya Moya syndrome. Hg is holding stable at 8.    - His chest pain is likely part of his painful crisis and less likely to be acute chest syndrome in the absence of respiratory symptoms, fever or acute findings on imaging.  His chest pain is improving.  - Right ankle swelling/pain, also probably due to his painful crisis. right lower extremity venous Doppler negative for DVT. Pt is starting to ambulate in the room but still having significant pain and not able to bear weight on extremity. Will consult PT service for evaluation and treatment.   - oral phenergan added to use as needed for nausea, zofran IV -If patient continues to progress, plan to switch to oral pain meds tomorrow and de-escalate IV dilaudid.  Pt has used 10 mg dilaudid, 8 demands, 7 deliveries.   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 He reports history of frequent transfusions.  - Following CBC every 24-48 hours, s/p 1 unit PRBC on 12/12.  - hemoglobin electrophoresis pending  - Pt did not follow up at the sickle cell center after his visit in Sept but is reporting today that he does plan to follow up with the sickle cell center.   HIV  - Patient has not started his HAART therapy but has been to the outpatient ID clinic - plans to follow up with ID oupatient   Possible Impetigo Right Leg and Ankle  - The crusted lesions on his right leg are weeping a honey-colored fluid.  This is concerning for a possible staph/strep infection.  A wound culture was taken at  bedside and sent to the lab for gram stain and culture.  Will empirically start patient on doxycycline pending cultures.  Because he is in the hospital, I am concerned about MRSA.   History of stroke secondary to MoyaMoya syndrome  - Continue aspirin for secondary stroke prophylaxis.   Leukocytosis - likely related to inflammation related to acute crisis event - WBC trending down  Hyperbilirubinemia and mild transaminitis  - Secondary to hemolytic anemia, check CMP in AM  Allergy to Heparins / Lovenox - Pt has been placed on SCDs while in bed and PT evaluation requested to assist with ambulation  Hypokalemia - ordered oral potassium supplement, recheck K in AM  Code Status: Full  Family Communication: discussed with patient's mother and stepfather at bedside after patient's consent.  Disposition Plan: home when medically stable. Not ready for discharge.   HPI/Subjective: Pt reports that he has been trying to ambulate and he is having significant back pain.  It is 8/10.  However his ankle is not nearly as painful as it had been yesterday.  He denies uncontrolled pruritus.  His nausea is improving.  No recent emesis.    Objective: Filed Vitals:   08/28/13 0956  BP:   Pulse:   Temp:   Resp: 18    Intake/Output Summary (Last 24 hours) at 08/28/13 1004 Last data filed at 08/28/13 0600  Gross per 24 hour  Intake 2596.67 ml  Output   1280 ml  Net 1316.67 ml   Filed Weights   08/26/13 0106 08/26/13 0145  Weight: 138 lb 7.2 oz (62.8 kg) 138 lb 7.2 oz (62.8 kg)   Exam:  General: Awake, alert, no distress, cooperative, bilateral scleral icterus unchanged Cardiovascular: normal s1, s2 sounds no MRG  Respiratory: BBS clear to auscultation  Abdomen: soft, nondistended, nontender, no masses palpated  Musculoskeletal: honey colored fluid weeping from crusted lesions on right ankle and leg  Data Reviewed: Basic Metabolic Panel:  Recent Labs Lab 08/25/13 1931 08/26/13 0535  08/27/13 0553  NA 135 135 134*  K 4.0 4.0 3.4*  CL 103 104 99  CO2 26 26 26   GLUCOSE 95 90 76  BUN 5* 3* 5*  CREATININE 0.55 0.45* 0.53  CALCIUM 9.0 8.9 8.6  MG  --  1.7  --    Liver Function Tests:  Recent Labs Lab 08/25/13 1931 08/26/13 0535 08/27/13 0553  AST 67* 62* 64*  ALT 30 31 31   ALKPHOS 91 89 106  BILITOT 6.8* 6.3* 7.6*  PROT 10.3* 9.7* 9.3*  ALBUMIN 3.6 3.4* 3.2*   No results found for this basename: LIPASE, AMYLASE,  in the last 168 hours No results found for this basename: AMMONIA,  in the last 168 hours CBC:  Recent Labs Lab 08/25/13 1931 08/26/13 0535 08/27/13 0553 08/28/13 0620  WBC 10.3 14.4* 13.1* 10.7*  NEUTROABS 3.0 6.7  --   --   HGB 7.3* 6.8* 8.2* 8.0*  HCT 20.3* 19.5* 22.8* 22.7*  MCV 87.9 87.1 87.0 86.0  PLT 316 294 294 287   Cardiac Enzymes:  Recent Labs Lab 08/25/13 1931  TROPONINI <0.30   BNP (last 3 results) No results found for this basename: PROBNP,  in the last 8760 hours CBG: No results found for this basename: GLUCAP,  in the last 168 hours  No results found for this or any previous visit (from the past 240 hour(s)).   Studies: No results found.  Scheduled Meds: . aspirin EC  81 mg Oral Daily  . folic acid  1 mg Oral Daily  . HYDROmorphone PCA 0.3 mg/mL   Intravenous Q4H  . ketorolac  30 mg Intravenous Q6H  . senna-docusate  1 tablet Oral BID   Continuous Infusions: . sodium chloride 100 mL/hr (08/28/13 0053)   Principal Problem:   Sickle cell pain crisis Active Problems:   History of Moyamoya syndrome   HIV (human immunodeficiency virus infection)   Anemia   Hyperbilirubinemia   Sickle cell anemia with crisis   Sickle cell crisis  Jeremy Mclaughlin [facility]  [facility] Pager [phone]. If 7PM-7AM, please contact night-coverage at [email or web address], password [facility] 08/28/2013, 10:04 AM  LOS: 3 days

## 2013-08-29 DIAGNOSIS — L0100 Impetigo, unspecified: Secondary | ICD-10-CM

## 2013-08-29 LAB — COMPREHENSIVE METABOLIC PANEL
Albumin: 3.2 g/dL — ABNORMAL LOW (ref 3.5–5.2)
Alkaline Phosphatase: 186 U/L — ABNORMAL HIGH (ref 39–117)
BUN: 4 mg/dL — ABNORMAL LOW (ref 6–23)
Calcium: 8.9 mg/dL (ref 8.4–10.5)
Chloride: 97 mEq/L (ref 96–112)
GFR calc non Af Amer: >90 mL/min (ref >90)
Potassium: 3.6 mEq/L (ref 3.5–5.1)
Sodium: 134 mEq/L — ABNORMAL LOW (ref 135–145)
Total Bilirubin: 8.3 mg/dL — ABNORMAL HIGH (ref 0.3–1.2)

## 2013-08-29 LAB — CBC
MCH: 31.3 pg (ref 26.0–34.0)
MCHC: 36.4 g/dL — ABNORMAL HIGH (ref 30.0–36.0)
MCV: 86.1 fL (ref 78.0–100.0)
Platelets: 302 10*3/uL (ref 150–400)
RDW: 19.1 % — ABNORMAL HIGH (ref 11.5–15.5)
WBC: 9.9 10*3/uL (ref 4.0–10.5)

## 2013-08-29 LAB — LACTATE DEHYDROGENASE: LDH: 645 U/L — ABNORMAL HIGH (ref 94–250)

## 2013-08-29 NOTE — Care Management Note (Signed)
  Page 1 of 1   08/29/2013     7:18:33 PM   CARE MANAGEMENT NOTE 08/29/2013  Patient:  Jeremy Mclaughlin, Jeremy Mclaughlin   Account Number:  [id number]  Date Initiated:  08/27/2013  Documentation initiated by:  [facility]  Subjective/Objective Assessment:   Sickle Cell Anemia     Action/Plan:   HOME UPON DISCHARGE WHEN STABLE   Anticipated DC Date:  09/01/2013   Anticipated DC Plan:  HOME W HOME HEALTH SERVICES      DC Planning Services  CM consult      Choice offered to / List presented to:             Status of service:  Completed, signed off Medicare Important Message given?   (If response is "NO", the following Medicare IM given date fields will be blank) Date Medicare IM given:   Date Additional Medicare IM given:    Discharge Disposition:    Per UR Regulation:  Reviewed for med. necessity/level of care/duration of stay  If discussed at Long Length of Stay Meetings, dates discussed:    Comments:

## 2013-08-29 NOTE — Evaluation (Signed)
Physical Therapy Evaluation Patient Details Name: Jeremy Mclaughlin MRN: [id number] DOB: May 09, 1986 Today's Date: 08/29/2013 Time: 4098-1191 PT Time Calculation (min): 13 min  PT Assessment / Plan / Recommendation History of Present Illness  27 y.o. 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 He states that this presentation is similar to his prior episodes of sickle cell crisis. He apparently was in his usual state of health until 6 PM on 08/25/13 when after he got out of the shower and subacutely experienced pain in his right ankle with associated swelling and over the next short duration he experienced pain across the entire right lower extremity, lower back and chest pain. He rates the pain between 7-9/10 in severity, sharp. He also complains of headache and nausea. He vomited twice which was bilious without coffee-ground or blood. He denies fevers, chills, difficulty breathing or cough. His chest pains are made worse by deep breathing or movement. In the ED, AST 67, total bilirubin 6.8, total protein 10.3, albumin 3.6, hemoglobin 7.3, d-dimer was positive which was followed by CT a chest which showed no evidence of PE or acute findings. Patient has received Dilaudid 2 mg x3 doses over the last couple of hours despite which he continues to have significant amount of pain. Hospitalist admission requested.   Clinical Impression  Pt admitted with above. Pt currently with functional limitations due to the deficits listed below (see PT Problem List).  Pt will benefit from skilled PT to increase their independence and safety with mobility to allow discharge to the venue listed below.  Pt c/o pain in R ankle and dorsal foot which is limiting his  mobility.  Pt states this is his usual pain during sickle cell crisis.  Pt agreeable to crutch training next visit and will likely only need 1-2 more acute care visits.     PT Assessment       Follow Up Recommendations  No PT follow up    Does the patient have the potential to tolerate intense rehabilitation      Barriers to Discharge        Equipment Recommendations  Crutches    Recommendations for Other Services     Frequency Min 3X/week    Precautions / Restrictions Precautions Precautions: None   Pertinent Vitals/Pain R ankle and dorsal foot pain increased to 9/10 during ambulation and HR increased from 89 at rest to 130 during ambulation      Mobility  Bed Mobility Bed Mobility: Supine to Sit Supine to Sit: 7: Independent Transfers Transfers: Sit to Stand;Stand to Sit Sit to Stand: 4: Min guard;From bed;From chair/3-in-1 Stand to Sit: 4: Min guard;To chair/3-in-1;To bed Details for Transfer Assistance: min/guard for safety due to PCA and pain Ambulation/Gait Ambulation/Gait Assistance: 4: Min guard Ambulation Distance (Feet): 280 Feet Assistive device: None Ambulation/Gait Assistance Details: pt pushed IV pole, increased antalgic gait as distance increased, pt reports pain increased to 9/10 R ankle, discussed using crutch/crutches next visit Gait Pattern: Step-through pattern;Antalgic General Gait Details: HR increased to 130 possibly due to increase in pain    Exercises     PT Diagnosis: Difficulty walking  PT Problem List: Decreased mobility;Pain;Decreased knowledge of use of DME PT Treatment Interventions: DME instruction;Gait training;Stair training;Functional mobility training;Therapeutic activities;Therapeutic exercise;Patient/family education  PT Goals(Current goals can be found in the care plan section) Acute Rehab PT Goals PT Goal Formulation: With patient Time For Goal Achievement: 09/05/13 Potential to Achieve Goals: Good  Visit  Information  Last PT Received On: 08/29/13 Assistance Needed: +1 History of Present Illness: 27 y.o. 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 He states that this presentation is similar to his prior episodes of sickle cell crisis. He apparently was in his usual state of health until 6 PM on 08/25/13 when after he got out of the shower and subacutely experienced pain in his right ankle with associated swelling and over the next short duration he experienced pain across the entire right lower extremity, lower back and chest pain. He rates the pain between 7-9/10 in severity, sharp. He also complains of headache and nausea. He vomited twice which was bilious without coffee-ground or blood. He denies fevers, chills, difficulty breathing or cough. His chest pains are made worse by deep breathing or movement. In the ED, AST 67, total bilirubin 6.8, total protein 10.3, albumin 3.6, hemoglobin 7.3, d-dimer was positive which was followed by CT a chest which showed no evidence of PE or acute findings. Patient has received Dilaudid 2 mg x3 doses over the last couple of hours despite which he continues to have significant amount of pain. Hospitalist admission requested.        Prior Functioning  Home Living Family/patient expects to be discharged to:: Private residence Living Arrangements: Parent Type of Home: Apartment Home Access: Stairs to enter Secretary/administrator of Steps: 2-3 Home Layout: One level Home Equipment: None Prior Function Level of Independence: Independent Communication Communication: No difficulties    Cognition  Cognition Arousal/Alertness: Awake/alert Behavior During Therapy: WFL for tasks assessed/performed Overall  Cognitive Status: Within Functional Limits for tasks assessed    Extremity/Trunk Assessment Lower Extremity Assessment Lower Extremity Assessment: Overall WFL for tasks assessed (however reports pain mostly in R ankle and bottom of foot)   Balance    End of Session PT - End of Session Activity Tolerance: Patient tolerated treatment well Patient left: with call bell/phone within reach;in chair  GP     Seanmichael Salmons,KATHrine E 08/29/2013, 12:03 PM Zenovia Jarred, PT, DPT 08/29/2013 Pager: [phone]

## 2013-08-29 NOTE — Progress Notes (Addendum)
SICKLE CELL SERVICE PROGRESS NOTE  Jeremy Mclaughlin ZOX:[id number] DOB: June 15, 1986 DOA: 08/25/2013 PCP: MATTHEWS,MICHELLE A., MD  Assessment/Plan: Hemoglobin SS disease with vaso-occlusive crisis  - Admitted to medical floor for management and pain control.  - Continuing gentle hypotonic IV fluids, being careful to avoid overhydration, IV Toradol, folate and Dilaudid PCA  - Monitor CBCs closely. Transfused 1 unit PRBC to goal Hg of 8 given history of MoyaMoya syndrome.  - His chest pain is resolved now, but likely was part of his painful crisis and less likely to be acute chest syndrome in the absence of respiratory symptoms, fever or acute findings on imaging. His chest pain is improved.  - Pt still having significant pain in legs but reports overall improvement.  - Pt having significant nausea today, possibly related to oral doxycycline, will monitor and dc if needed - Right ankle swelling/pain, also probably due to his painful crisis. right lower extremity venous Doppler negative for DVT. Pt is starting to ambulate in the room but still having significant pain and not able to bear weight on extremity. Will consult PT service for evaluation and treatment.  - oral phenergan added to use as needed for nausea, zofran IV   Anemia secondary to hemoglobin SS disease  - As per patient and mother, hemoglobin almost close to baseline. He reports history of frequent transfusions.  - Following CBC, s/p 1 unit PRBC on 12/12.  - hemoglobin electrophoresis pending  - Pt did not follow up at the sickle cell center after his visit in Sept but is reporting today that he does plan to follow up with the sickle cell center.   HIV  - Patient has not started his HAART therapy but has been to the outpatient ID clinic - plans to follow up with ID oupatient   Impetigo Right Leg and Ankle  - The crusted lesions on his right leg / ankle were weeping a honey-colored fluid concerning for a possible staph/strep infection.  A wound culture was taken at bedside and sent to the lab for gram stain and culture. Will empirically start patient on doxycycline pending cultures. Wounds are healing after treatment started.    History of stroke secondary to MoyaMoya syndrome  - Continue aspirin for secondary stroke prophylaxis.   Leukocytosis  - likely related to inflammation related to acute crisis event  - WBC trending down   Hyperbilirubinemia and mild transaminitis  - Secondary to hemolytic anemia, check CMP in AM  - bilirubin higher this morning - continue gentle IVFs, monitor - benadryl as needed for itching  Allergy to Heparins / Lovenox  - Pt has been placed on SCDs while in bed and PT evaluation requested to assist with ambulation   Hypokalemia  - repleted, following oral potassium supplement, recheck K in AM   Code Status: Full  Family Communication: No family at bedside  Disposition Plan: home when medically stable. Not ready for discharge.   HPI/Subjective: Pt reports that he is having significant nausea this morning.  His pain is 7/10.  He says that overall he feels he is getting better.  He has not vomited but feels that he is on the verge. Also, he does not want to eat his breakfast.  The pain is primarily in both legs today.    Objective: Filed Vitals:   08/29/13 0746  BP:   Pulse:   Temp:   Resp: 16    Intake/Output Summary (Last 24 hours) at 08/29/13 0930 Last data filed at 08/29/13 0700  Gross per 24 hour  Intake 3194.58 ml  Output   3500 ml  Net -305.42 ml   Filed Weights   08/26/13 0106 08/26/13 0145  Weight: 138 lb 7.2 oz (62.8 kg) 138 lb 7.2 oz (62.8 kg)    Exam:  General: Awake, alert, no distress, cooperative, bilateral scleral icterus unchanged  Cardiovascular: normal s1, s2 sounds no MRG  Respiratory: BBS clear to auscultation  Abdomen: soft, nondistended, nontender, no masses palpated  Musculoskeletal: crusted lesions on right ankle and leg healing appears dry, no  weeping of fluid noted today  Data Reviewed: Basic Metabolic Panel:  Recent Labs Lab 08/25/13 1931 08/26/13 0535 08/27/13 0553 08/29/13 0525  NA 135 135 134* 134*  K 4.0 4.0 3.4* 3.6  CL 103 104 99 97  CO2 26 26 26 29   GLUCOSE 95 90 76 87  BUN 5* 3* 5* 4*  CREATININE 0.55 0.45* 0.53 0.48*  CALCIUM 9.0 8.9 8.6 8.9  MG  --  1.7  --   --    Liver Function Tests:  Recent Labs Lab 08/25/13 1931 08/26/13 0535 08/27/13 0553 08/29/13 0525  AST 67* 62* 64* 107*  ALT 30 31 31  69*  ALKPHOS 91 89 106 186*  BILITOT 6.8* 6.3* 7.6* 8.3*  PROT 10.3* 9.7* 9.3* 9.4*  ALBUMIN 3.6 3.4* 3.2* 3.2*   No results found for this basename: LIPASE, AMYLASE,  in the last 168 hours No results found for this basename: AMMONIA,  in the last 168 hours CBC:  Recent Labs Lab 08/25/13 1931 08/26/13 0535 08/27/13 0553 08/28/13 0620  WBC 10.3 14.4* 13.1* 10.7*  NEUTROABS 3.0 6.7  --   --   HGB 7.3* 6.8* 8.2* 8.0*  HCT 20.3* 19.5* 22.8* 22.7*  MCV 87.9 87.1 87.0 86.0  PLT 316 294 294 287   Cardiac Enzymes:  Recent Labs Lab 08/25/13 1931  TROPONINI <0.30   BNP (last 3 results) No results found for this basename: PROBNP,  in the last 8760 hours CBG: No results found for this basename: GLUCAP,  in the last 168 hours  Recent Results (from the past 240 hour(s))  WOUND CULTURE     Status: None   Collection Time    08/28/13 10:08 AM      Result Value Range Status   Specimen Description LEG   Final   Special Requests Immunocompromised   Final   Gram Stain PENDING   Incomplete   Culture     Final   Value: Culture reincubated for better growth     Performed at [facility]   Report Status PENDING   Incomplete   Studies: No results found.  Scheduled Meds: . aspirin EC  81 mg Oral Daily  . doxycycline  100 mg Oral Q12H  . folic acid  1 mg Oral Daily  . HYDROmorphone PCA 0.3 mg/mL   Intravenous Q4H  . ketorolac  30 mg Intravenous Q6H  . pantoprazole  40 mg Oral Q0600  .  senna-docusate  1 tablet Oral BID   Continuous Infusions: . sodium chloride 75 mL/hr at 08/28/13 1946   Principal Problem:   Sickle cell pain crisis Active Problems:   History of Moyamoya syndrome   HIV (human immunodeficiency virus infection)   Anemia   Hyperbilirubinemia   Sickle cell anemia with crisis   Sickle cell crisis   Impetigo   Scleral icterus  [employer] [phone]. If 7PM-7AM, please contact night-coverage at [email or web address], password [facility] 08/29/2013, 9:30  AM  LOS: 4 days

## 2013-08-29 NOTE — ED Provider Notes (Signed)
Medical screening examination/treatment/procedure(s) were performed by non-physician practitioner and as supervising physician I was immediately available for consultation/collaboration.  EKG Interpretation    Date/Time:  Thursday August 25 2013 20:22:54 EST Ventricular Rate:  70 PR Interval:  183 QRS Duration: 110 QT Interval:  400 QTC Calculation: 432 R Axis:   70 Text Interpretation:  Sinus rhythm RSR' in V1 or V2, probably normal variant Probable left ventricular hypertrophy Nonspecific T abnrm, anterolateral leads ED PHYSICIAN INTERPRETATION AVAILABLE IN [facility] Confirmed by TEST, RECORD ([id number]) on 08/27/2013 12:35:00 PM             Ethelda Chick, MD 08/29/13 [phone]

## 2013-08-30 DIAGNOSIS — E876 Hypokalemia: Secondary | ICD-10-CM

## 2013-08-30 DIAGNOSIS — R7881 Bacteremia: Secondary | ICD-10-CM

## 2013-08-30 DIAGNOSIS — A4901 Methicillin susceptible Staphylococcus aureus infection, unspecified site: Secondary | ICD-10-CM

## 2013-08-30 LAB — HEPATITIS PANEL, ACUTE
HCV Ab: NEGATIVE
Hep A IgM: NONREACTIVE
Hep B C IgM: NONREACTIVE

## 2013-08-30 LAB — CBC
Hemoglobin: 7.4 g/dL — ABNORMAL LOW (ref 13.0–17.0)
MCH: 30.5 pg (ref 26.0–34.0)
MCHC: 36.1 g/dL — ABNORMAL HIGH (ref 30.0–36.0)
Platelets: 285 10*3/uL (ref 150–400)
RBC: 2.43 MIL/uL — ABNORMAL LOW (ref 4.22–5.81)
RDW: 19.2 % — ABNORMAL HIGH (ref 11.5–15.5)
WBC: 13.1 10*3/uL — ABNORMAL HIGH (ref 4.0–10.5)

## 2013-08-30 LAB — DIFFERENTIAL
Basophils Absolute: 0 10*3/uL (ref 0.0–0.1)
Basophils Relative: 0 % (ref 0–1)
Eosinophils Relative: 2 % (ref 0–5)
Lymphocytes Relative: 23 % (ref 12–46)
Neutro Abs: 8.9 10*3/uL — ABNORMAL HIGH (ref 1.7–7.7)

## 2013-08-30 LAB — COMPREHENSIVE METABOLIC PANEL
ALT: 58 U/L — ABNORMAL HIGH (ref 0–53)
AST: 96 U/L — ABNORMAL HIGH (ref 0–37)
Albumin: 3.3 g/dL — ABNORMAL LOW (ref 3.5–5.2)
Alkaline Phosphatase: 176 U/L — ABNORMAL HIGH (ref 39–117)
CO2: 30 mEq/L (ref 19–32)
Calcium: 8.8 mg/dL (ref 8.4–10.5)
GFR calc non Af Amer: >90 mL/min (ref >90)
Glucose, Bld: 94 mg/dL (ref 70–99)
Potassium: 3.3 mEq/L — ABNORMAL LOW (ref 3.5–5.1)
Sodium: 133 mEq/L — ABNORMAL LOW (ref 135–145)
Total Protein: 9.6 g/dL — ABNORMAL HIGH (ref 6.0–8.3)

## 2013-08-30 LAB — HEMOGLOBINOPATHY EVALUATION
Hemoglobin Other: 0 %
Hgb A: 34.6 % — ABNORMAL LOW (ref 96.8–97.8)
Hgb S Quant: 59.2 % — ABNORMAL HIGH

## 2013-08-30 LAB — TYPE AND SCREEN
ABO/RH(D): A POS
Antibody Screen: NEGATIVE
Unit division: 0
Unit division: 0

## 2013-08-30 LAB — MAGNESIUM: Magnesium: 1.3 mg/dL — ABNORMAL LOW (ref 1.5–2.5)

## 2013-08-30 MED ORDER — HYDROMORPHONE HCL PF 1 MG/ML IJ SOLN: 0.5000 mg | INTRAMUSCULAR | Status: DC | PRN

## 2013-08-30 MED ORDER — HYDROCODONE-ACETAMINOPHEN 7.5-325 MG PO TABS
1.0000 | ORAL_TABLET | ORAL | Status: DC
  Administered 2013-08-30 – 2013-08-31 (×6): 1 via ORAL
  Filled 2013-08-30 (×6): qty 1

## 2013-08-30 MED ORDER — MAGNESIUM SULFATE 40 MG/ML IJ SOLN
2.0000 g | Freq: Once | INTRAMUSCULAR | Status: AC
  Administered 2013-08-30: 2 g via INTRAVENOUS
  Filled 2013-08-30: qty 50

## 2013-08-30 MED ORDER — POTASSIUM CHLORIDE CRYS ER 20 MEQ PO TBCR
40.0000 meq | EXTENDED_RELEASE_TABLET | ORAL | Status: AC
  Administered 2013-08-30 (×3): 40 meq via ORAL
  Filled 2013-08-30 (×3): qty 2

## 2013-08-30 MED ORDER — DIPHENHYDRAMINE HCL 25 MG PO CAPS
25.0000 mg | ORAL_CAPSULE | Freq: Four times a day (QID) | ORAL | Status: DC | PRN
  Administered 2013-08-30: 25 mg via ORAL
  Filled 2013-08-30: qty 1

## 2013-08-30 NOTE — Progress Notes (Signed)
Discussed in long length of stay rounds. 

## 2013-08-30 NOTE — Progress Notes (Signed)
SICKLE CELL SERVICE PROGRESS NOTE  Jeremy Mclaughlin YNW:[id number] DOB: 1986/08/05 DOA: 08/25/2013 PCP: MATTHEWS,MICHELLE A., MD  Assessment/Plan: 1. Staph Aureus skin infection/Impetigo:Lesions dried. On doxycycline. Continue until sensitivities available.  2. Hb SS with crisis:  Pt states that his pain is 7/10 which is his baseline. He has been able to ambulate with the use of a crutch. I will transition to oral mediations with clinician assisted doses for breakthrough pain. Continue Toradol and decrease IVF to [facility]. Anticipate discharge home tomorrow. 3. H/O Moyamoya syndrome: Pt states that he has been obtaining ed cell pheresis for the last several months from [facility] however I was unable to verify this on Care Everywhere. Pt has received a transfusion of 1 Unit of PRBC's and is at his baseline per his report. I have tried to speak with someone at [facility] to verify his reports of red cel;l pheresis but have been unable to reach anyone. 4. Hypokalemia/Hypomagnesemia: will replete magnesium by IV and potassium orally. Re check potassium in morning. 5. HIV: Pt under care of Dr. Luciana Axe. He has not yet started his HAART.  Staph Aureus: Code Status: Full Code Family Communication: N/A Disposition Plan: Home in 24 hours  MATTHEWS,MICHELLE A.  Pager [phone]. If 7PM-7AM, please contact night-coverage.  08/30/2013, 5:19 PM  LOS: 5 days   Brief narrative: Pt is a 27 y.o. 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 He states that this presentation is similar to his prior episodes of sickle cell crisis. He apparently was in his usual state of health until 6 PM on 08/25/13 when after he got out of the shower and subacutely experienced pain in  his right ankle with associated swelling and over the next short duration he experienced pain across the entire right lower extremity, lower back and chest pain. He rates the pain between 7-9/10 in severity, sharp. He also complains of headache and nausea. He vomited twice which was bilious without coffee-ground or blood. He denies fevers, chills, difficulty breathing or cough. His chest pains are made worse by deep breathing or movement. In the ED, AST 67, total bilirubin 6.8, total protein 10.3, albumin 3.6, hemoglobin 7.3, d-dimer was positive which was followed by CT a chest which showed no evidence of PE or acute findings. Patient has received Dilaudid 2 mg x3 doses over the last couple of hours despite which he continues to have significant amount of pain. Hospitalist admission requested.     Consultants:  None  Procedures:  None  Antibiotics:  Doxycycline 12/14 >>  HPI/Subjective: Pt states that he feels almost at his baseline and should be able to go home tomorrow.  Objective: Filed Vitals:   08/30/13 0842 08/30/13 1000 08/30/13 1225 08/30/13 1500  BP:  144/83  143/78  Pulse:  86  79  Temp:  98.3 F (36.8 C)  99.4 F (37.4 C)  TempSrc:  Oral  Oral  Resp: 14 16 15 20   Height:      Weight:      SpO2: 95% 100% 98% 92%   Weight change:   Intake/Output Summary (Last 24 hours) at 08/30/13 1719 Last data filed at 08/30/13 0940  Gross per 24 hour  Intake 1382.5 ml  Output   2900 ml  Net -1517.5 ml    General:  Alert, awake, oriented x3, in no acute distress.  HEENT: /AT PEERL, EOMI, anicteric Heart: Regular rate and rhythm, without murmurs, rubs, gallops.  Lungs: Clear to auscultation, no wheezing or rhonchi noted.  Abdomen: Soft, nontender, nondistended, positive bowel sounds, no masses no hepatosplenomegaly noted.  Neuro: No focal neurological deficits noted cranial nerves II through XII grossly intact. Strength normal in bilateral upper and lower  extremities. Musculoskeletal: No warm swelling or erythema around joints, no spinal tenderness noted. Psychiatric: Patient alert and oriented x3, good insight and cognition, good recent to remote recall.  Data Reviewed: Basic Metabolic Panel:  Recent Labs Lab 08/25/13 1931 08/26/13 0535 08/27/13 0553 08/29/13 0525 08/30/13 0545 08/30/13 0550  NA 135 135 134* 134*  --  133*  K 4.0 4.0 3.4* 3.6  --  3.3*  CL 103 104 99 97  --  96  CO2 26 26 26 29   --  30  GLUCOSE 95 90 76 87  --  94  BUN 5* 3* 5* 4*  --  5*  CREATININE 0.55 0.45* 0.53 0.48*  --  0.49*  CALCIUM 9.0 8.9 8.6 8.9  --  8.8  MG  --  1.7  --   --  1.3*  --    Liver Function Tests:  Recent Labs Lab 08/25/13 1931 08/26/13 0535 08/27/13 0553 08/29/13 0525 08/30/13 0550  AST 67* 62* 64* 107* 96*  ALT 30 31 31  69* 58*  ALKPHOS 91 89 106 186* 176*  BILITOT 6.8* 6.3* 7.6* 8.3* 9.9*  PROT 10.3* 9.7* 9.3* 9.4* 9.6*  ALBUMIN 3.6 3.4* 3.2* 3.2* 3.3*   CBC:  Recent Labs Lab 08/25/13 1931 08/26/13 0535 08/27/13 0553 08/28/13 0620 08/29/13 0525 08/30/13 0550  WBC 10.3 14.4* 13.1* 10.7* 9.9 13.1*  NEUTROABS 3.0 6.7  --   --   --  8.9*  HGB 7.3* 6.8* 8.2* 8.0* 7.9* 7.4*  HCT 20.3* 19.5* 22.8* 22.7* 21.7* 20.5*  MCV 87.9 87.1 87.0 86.0 86.1 84.4  PLT 316 294 294 287 302 285   Cardiac Enzymes:  Recent Labs Lab 08/25/13 1931  TROPONINI <0.30   BNP (last 3 results) No results found for this basename: PROBNP,  in the last 8760 hours CBG: No results found for this basename: GLUCAP,  in the last 168 hours  Recent Results (from the past 240 hour(s))  WOUND CULTURE     Status: None   Collection Time    08/28/13 10:08 AM      Result Value Range Status   Specimen Description LEG   Final   Special Requests Immunocompromised   Final   Gram Stain     Final   Value: NO WBC SEEN     NO SQUAMOUS EPITHELIAL CELLS SEEN     FEW GRAM POSITIVE COCCI     IN PAIRS     Performed at [facility]   Culture      Final   Value: MODERATE STAPHYLOCOCCUS AUREUS     Note: RIFAMPIN AND GENTAMICIN SHOULD NOT BE USED AS SINGLE DRUGS FOR TREATMENT OF STAPH INFECTIONS.     ABUNDANT STREPTOCOCCUS GROUP G     Performed at [facility]   Report Status PENDING   Incomplete     Studies: Dg Chest 2 View  08/25/2013   CLINICAL DATA:  Chest pain today, history sickle cell anemia, HIV, stroke  EXAM: CHEST  2 VIEW  COMPARISON:  03/08/2013  FINDINGS: Enlargement of cardiac silhouette with pulmonary vascular congestion.  Mediastinal  contours normal.  Lungs clear.  No pleural effusion or pneumothorax.  Osseous structures unremarkable.  IMPRESSION: Enlargement of cardiac silhouette with pulmonary vascular congestion consistent with history of sickle cell disease.  No acute infiltrate.   Electronically Signed   By: Ulyses Southward M.D.   On: 08/25/2013 20:18   Ct Angio Chest Pe W/cm &/or Wo Cm  08/25/2013   CLINICAL DATA:  Shortness of breath; sickle cell crisis. Evaluate for pulmonary embolus.  EXAM: CT ANGIOGRAPHY CHEST WITH CONTRAST  TECHNIQUE: Multidetector CT imaging of the chest was performed using the standard protocol during bolus administration of intravenous contrast. Multiplanar CT image reconstructions including MIPs were obtained to evaluate the vascular anatomy.  CONTRAST:  OMNIPAQUE IOHEXOL 350 MG/ML SOLN  COMPARISON:  Chest radiograph performed earlier today at 08:12 p.m.  FINDINGS: There is no evidence of pulmonary embolus.  Minimal left basilar atelectasis is noted. There is mild diffuse prominence of the visualized pulmonary vasculature, compatible with vascular congestion. There is no evidence of significant focal consolidation, pleural effusion or pneumothorax. No masses are identified; no abnormal focal contrast enhancement is seen.  The mediastinum is unremarkable in appearance. No mediastinal lymphadenopathy is seen. No pericardial effusion is identified. The great vessels are grossly unremarkable in  appearance.  Enlarged bilateral axillary nodes are seen, measuring up to 3.2 cm in short axis; these are of uncertain significance. The thyroid gland is unremarkable in appearance.  The visualized portions of the liver are unremarkable. The spleen is markedly atrophic.  No acute osseous abnormalities are seen.  Review of the MIP images confirms the above findings.  IMPRESSION: 1. No evidence of pulmonary embolus. 2. Minimal left basilar atelectasis noted. Underlying vascular congestion noted. Lungs otherwise clear. 3. Significantly enlarged bilateral axillary nodes, measuring up to 3.2 cm in short axis. These are of uncertain significance. They are close to the skin surface and would be amenable to biopsy, as deemed clinically appropriate.   Electronically Signed   By: Roanna Raider M.D.   On: 08/25/2013 23:47    Scheduled Meds: . aspirin EC  81 mg Oral Daily  . doxycycline  100 mg Oral Q12H  . folic acid  1 mg Oral Daily  . HYDROcodone-acetaminophen  1 tablet Oral Q4H  . ketorolac  30 mg Intravenous Q6H  . pantoprazole  40 mg Oral Q0600  . senna-docusate  1 tablet Oral BID   Total time spent 35 minutes.

## 2013-08-30 NOTE — Progress Notes (Signed)
Physical Therapy Treatment Patient Details Name: Jeremy Mclaughlin MRN: [id number] DOB: [birth year] Today's Date: 08/30/2013 Time: 4098-1191 PT Time Calculation (min): 19 min  PT Assessment / Plan / Recommendation  History of Present Illness 27 y.o. 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 He states that this presentation is similar to his prior episodes of sickle cell crisis. He apparently was in his usual state of health until 6 PM on 08/25/13 when after he got out of the shower and subacutely experienced pain in his right ankle with associated swelling and over the next short duration he experienced pain across the entire right lower extremity, lower back and chest pain. He rates the pain between 7-9/10 in severity, sharp. He also complains of headache and nausea. He vomited twice which was bilious without coffee-ground or blood. He denies fevers, chills, difficulty breathing or cough. His chest pains are made worse by deep breathing or movement. In the ED, AST 67, total bilirubin 6.8, total protein 10.3, albumin 3.6, hemoglobin 7.3, d-dimer was positive which was followed by CT a chest which showed no evidence of PE or acute findings. Patient has received Dilaudid 2 mg x3 doses over the last couple of hours despite which he continues to have significant amount of pain. Hospitalist admission requested.    PT Comments   Pt able to gait with mod I with 1 crutch to decrease pain in R LE.  Pt reports pain "all over" and was unable to tolerate exercise training after gait.  Will benefit from 1 more PT session to review HEP for strengthening at home.  Follow Up Recommendations  No PT follow up     Does the patient have the potential to tolerate  intense rehabilitation     Barriers to Discharge        Equipment Recommendations  Crutches    Recommendations for Other Services    Frequency Min 3X/week   Progress towards PT Goals Progress towards PT goals: Progressing toward goals  Plan Current plan remains appropriate    Precautions / Restrictions Precautions Precautions: None Restrictions Weight Bearing Restrictions: No   Pertinent Vitals/Pain Pt c/o pain "all over".  RN aware.    Mobility  Bed Mobility Supine to Sit: 7: Independent Transfers Sit to Stand: 6: Modified independent (Device/Increase time) Stand to Sit: 6: Modified independent (Device/Increase time) Details for Transfer Assistance: uses 1 crutch under L UE Ambulation/Gait Ambulation/Gait Assistance: 6: Modified independent (Device/Increase time) Ambulation Distance (Feet): 150 Feet Assistive device: Crutches Ambulation/Gait Assistance Details: pt uses 1 crutch under L UE to help decrease R foot pain, pt states it helps "a little" but he still feels pain "all over".  Pt states he has crutches at home he can use    Exercises     PT Diagnosis:    PT Problem List:   PT Treatment Interventions:     PT Goals (current goals can now be found in the care plan section)    Visit Information  Last PT Received On: 08/30/13 Assistance Needed: +1 History of Present Illness: 27 y.o. 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 He states that this presentation is similar to his prior episodes of sickle cell crisis. He apparently was in his usual state of health until 6 PM on 08/25/13 when after he got out of the shower and subacutely experienced pain in his right ankle with associated swelling and over the next  short duration he experienced pain across the entire right lower extremity, lower back and chest pain. He rates the pain between 7-9/10 in severity, sharp. He also complains of headache and nausea. He vomited twice which was bilious without coffee-ground or blood. He denies fevers, chills, difficulty breathing or cough. His chest pains are made worse by deep breathing or movement. In the ED, AST 67, total bilirubin 6.8, total protein 10.3, albumin 3.6, hemoglobin 7.3, d-dimer was positive which was followed by CT a chest which showed no evidence of PE or acute findings. Patient has received Dilaudid 2 mg x3 doses over the last couple of hours despite which he continues to have significant amount of pain. Hospitalist admission requested.     Subjective Data      Cognition  Cognition Arousal/Alertness: Awake/alert Behavior During Therapy: Flat affect Overall Cognitive Status: Within Functional Limits for tasks assessed    Balance  Static Standing Balance Static Standing - Balance Support: During functional activity Static Standing - Level of Assistance: 6: Modified independent (Device/Increase time)  End of Session PT - End of Session Equipment Utilized During Treatment: Gait belt Activity Tolerance: Patient limited by fatigue;Patient limited by pain Patient left: in bed;with call bell/phone within reach   GP     [facility] 08/30/2013, 11:45 AM

## 2013-08-31 ENCOUNTER — Other Ambulatory Visit: Payer: Self-pay | Admitting: Internal Medicine

## 2013-08-31 ENCOUNTER — Telehealth: Payer: Self-pay | Admitting: Internal Medicine

## 2013-08-31 LAB — COMPREHENSIVE METABOLIC PANEL
ALT: 52 U/L (ref 0–53)
Albumin: 3.5 g/dL (ref 3.5–5.2)
Alkaline Phosphatase: 172 U/L — ABNORMAL HIGH (ref 39–117)
BUN: 7 mg/dL (ref 6–23)
CO2: 29 mEq/L (ref 19–32)
Calcium: 9.5 mg/dL (ref 8.4–10.5)
GFR calc Af Amer: >90 mL/min (ref >90)
Potassium: 4.3 mEq/L (ref 3.5–5.1)
Sodium: 134 mEq/L — ABNORMAL LOW (ref 135–145)
Total Protein: 10 g/dL — ABNORMAL HIGH (ref 6.0–8.3)

## 2013-08-31 LAB — WOUND CULTURE: Gram Stain: NONE SEEN

## 2013-08-31 MED ORDER — DOXYCYCLINE HYCLATE 100 MG PO TABS: 100.0000 mg | ORAL_TABLET | Freq: Two times a day (BID) | ORAL | Status: DC

## 2013-08-31 NOTE — Progress Notes (Signed)
Patient discharged in stable medical condition.  Patient given all discharge instructions, with a verbalized understanding of discharge instructions and plan. Patient to pick up prescription at [facility] for pain medicine.  Philomena Doheny RN

## 2013-08-31 NOTE — Discharge Summary (Signed)
Physician Discharge Summary  Patient ID: Jeremy Mclaughlin MRN: [id number] DOB/AGE: 10-Apr-1986 27 y.o.  Admit date: 08/25/2013 Discharge date: 08/31/2013  Admission Diagnoses:  Discharge Diagnoses:  Principal Problem:   Sickle cell pain crisis Active Problems:   History of Moyamoya syndrome   HIV (human immunodeficiency virus infection)   Anemia   Hyperbilirubinemia   Sickle cell anemia with crisis   Sickle cell crisis   Impetigo   Scleral icterus   Hypokalemia   Hypomagnesemia   Staphylococcus aureus bacteremia   Discharged Condition: fair  Hospital Course: A 27 year old man admitted with Staph aureus skin infection and sickle cell painful crisis. Patient has history of moyamoya syndrome as well as HIV disease. His Staph aureus infection of the skin was due to pansensitive organism. He was therefore discharged on doxycycline orally for another 5 days. He received Dilaudid PCA Toradol and excessive hydration. [facility] for his exchange transfusion. He is also to continue his management of HIV with his infectious disease physician. He will need to start his HAA RT medications as soon as possible. At time of discharge his pain has dropped to 3-4/10. He will continue his oral pain medications as prescribed. He'll follow up with Dr. [name] in the outpatient setting for continued care.  Consults: None  Significant Diagnostic Studies: labs: Serial CBCs and CMPs now stable. Mild hyponatremia.  Treatments: IV hydration and analgesia: acetaminophen and Dilaudid  Discharge Exam: Blood pressure 131/83, pulse 77, temperature 98.3 F (36.8 C), temperature source Oral, resp. rate 20, height 5\' 4"  (1.626 m), weight 62.8 kg (138 lb 7.2 oz), SpO2 100.00%. General appearance: alert, cooperative and no distress Eyes: positive findings: sclera icteric Neck: no adenopathy, no carotid bruit, no JVD, supple, symmetrical, trachea midline and thyroid not  enlarged, symmetric, no tenderness/mass/nodules Back: symmetric, no curvature. ROM normal. No CVA tenderness. Resp: clear to auscultation bilaterally Chest wall: no tenderness Cardio: regular rate and rhythm, S1, S2 normal, no murmur, click, rub or gallop GI: soft, non-tender; bowel sounds normal; no masses,  no organomegaly Extremities: extremities normal, atraumatic, no cyanosis or edema Pulses: 2+ and symmetric Skin: Skin color, texture, turgor normal. No rashes or lesions Neurologic: Grossly normal  Disposition: 01-Home or Self Care     Medication List    STOP taking these medications       HYDROcodone-acetaminophen 5-325 MG per tablet  Commonly known as:  NORCO/VICODIN      TAKE these medications       aspirin EC 81 MG tablet  Take 81 mg by mouth daily.     elvitegravir-cobicistat-emtricitabine-tenofovir 150-150-200-300 MG Tabs tablet  Commonly known as:  STRIBILD  Take 1 tablet by mouth daily.     folic acid 1 MG tablet  Commonly known as:  FOLVITE  Take 1 tablet (1 mg total) by mouth daily. Take 1 tablet (1 mg total) by mouth daily.     promethazine 25 MG tablet  Commonly known as:  PHENERGAN  Take 25 mg by mouth every 6 (six) hours as needed for nausea.           Follow-up Information   Follow up with MATTHEWS,MICHELLE A., MD. Schedule an appointment as soon as possible for a visit in 1 week. [facility] Followup )    Specialty:  Internal Medicine   Contact information:   [address] [place] [address] [phone]       Follow up with Judyann Munson,  MD. Schedule an appointment as soon as possible for a visit in 1 week. (Follow Up )    Specialty:  Infectious Diseases   Contact information:   [address] [place] [address] [phone]       Signed: Lonia Blood 08/31/2013, 8:01 AM

## 2013-09-01 ENCOUNTER — Encounter (HOSPITAL_COMMUNITY): Payer: Self-pay | Admitting: Emergency Medicine

## 2013-09-01 ENCOUNTER — Telehealth: Payer: Self-pay | Admitting: *Deleted

## 2013-09-01 ENCOUNTER — Emergency Department (HOSPITAL_COMMUNITY)
Admission: EM | Admit: 2013-09-01 | Discharge: 2013-09-01 | Disposition: A | Discharge status: Home / Self Care | Payer: Medicaid Other | Attending: Emergency Medicine | Admitting: Emergency Medicine

## 2013-09-01 ENCOUNTER — Other Ambulatory Visit: Payer: Self-pay | Admitting: Internal Medicine

## 2013-09-01 DIAGNOSIS — K625 Hemorrhage of anus and rectum: Secondary | ICD-10-CM | POA: Insufficient documentation

## 2013-09-01 DIAGNOSIS — Z8673 Personal history of transient ischemic attack (TIA), and cerebral infarction without residual deficits: Secondary | ICD-10-CM | POA: Insufficient documentation

## 2013-09-01 DIAGNOSIS — Z21 Asymptomatic human immunodeficiency virus [HIV] infection status: Secondary | ICD-10-CM | POA: Insufficient documentation

## 2013-09-01 DIAGNOSIS — Z7982 Long term (current) use of aspirin: Secondary | ICD-10-CM | POA: Insufficient documentation

## 2013-09-01 DIAGNOSIS — D571 Sickle-cell disease without crisis: Secondary | ICD-10-CM

## 2013-09-01 DIAGNOSIS — Z9889 Other specified postprocedural states: Secondary | ICD-10-CM | POA: Insufficient documentation

## 2013-09-01 DIAGNOSIS — D5700 Hb-SS disease with crisis, unspecified: Secondary | ICD-10-CM

## 2013-09-01 DIAGNOSIS — F17200 Nicotine dependence, unspecified, uncomplicated: Secondary | ICD-10-CM | POA: Insufficient documentation

## 2013-09-01 DIAGNOSIS — Z79899 Other long term (current) drug therapy: Secondary | ICD-10-CM | POA: Insufficient documentation

## 2013-09-01 DIAGNOSIS — Z8619 Personal history of other infectious and parasitic diseases: Secondary | ICD-10-CM | POA: Insufficient documentation

## 2013-09-01 LAB — CBC WITH DIFFERENTIAL/PLATELET
Basophils Absolute: 0.1 10*3/uL (ref 0.0–0.1)
Eosinophils Absolute: 0.2 10*3/uL (ref 0.0–0.7)
Hemoglobin: 7.3 g/dL — ABNORMAL LOW (ref 13.0–17.0)
Lymphocytes Relative: 60 % — ABNORMAL HIGH (ref 12–46)
Lymphs Abs: 7 10*3/uL — ABNORMAL HIGH (ref 0.7–4.0)
MCH: 29.9 pg (ref 26.0–34.0)
MCHC: 35.8 g/dL (ref 30.0–36.0)
Monocytes Absolute: 1.2 10*3/uL — ABNORMAL HIGH (ref 0.1–1.0)
Neutrophils Relative %: 27 % — ABNORMAL LOW (ref 43–77)
Platelets: 359 10*3/uL (ref 150–400)
RDW: 19.5 % — ABNORMAL HIGH (ref 11.5–15.5)

## 2013-09-01 LAB — COMPREHENSIVE METABOLIC PANEL
ALT: 40 U/L (ref 0–53)
AST: 68 U/L — ABNORMAL HIGH (ref 0–37)
BUN: 8 mg/dL (ref 6–23)
Calcium: 9.1 mg/dL (ref 8.4–10.5)
Sodium: 135 mEq/L (ref 135–145)
Total Protein: 10.7 g/dL — ABNORMAL HIGH (ref 6.0–8.3)

## 2013-09-01 LAB — RETICULOCYTES
RBC.: 2.44 MIL/uL — ABNORMAL LOW (ref 4.22–5.81)
Retic Count, Absolute: 375.8 10*3/uL — ABNORMAL HIGH (ref 19.0–186.0)

## 2013-09-01 MED ORDER — SODIUM CHLORIDE 0.9 % IV SOLN
INTRAVENOUS | Status: DC
  Administered 2013-09-01: 15:00:00 via INTRAVENOUS

## 2013-09-01 MED ORDER — ONDANSETRON HCL 4 MG/2ML IJ SOLN
4.0000 mg | Freq: Once | INTRAMUSCULAR | Status: AC
  Administered 2013-09-01: 4 mg via INTRAVENOUS
  Filled 2013-09-01: qty 2

## 2013-09-01 MED ORDER — HYDROMORPHONE HCL PF 2 MG/ML IJ SOLN
2.5000 mg | Freq: Once | INTRAMUSCULAR | Status: AC
  Administered 2013-09-01: 2.5 mg via INTRAVENOUS
  Filled 2013-09-01: qty 2

## 2013-09-01 MED ORDER — HYDROMORPHONE HCL PF 2 MG/ML IJ SOLN
2.0000 mg | Freq: Once | INTRAMUSCULAR | Status: AC
  Administered 2013-09-01: 2 mg via INTRAVENOUS
  Filled 2013-09-01: qty 1

## 2013-09-01 MED ORDER — HYDROCODONE-ACETAMINOPHEN 5-325 MG PO TABS: 1.0000 | ORAL_TABLET | ORAL | Status: DC | PRN

## 2013-09-01 MED ORDER — HYDROMORPHONE HCL PF 2 MG/ML IJ SOLN
1.6000 mg | Freq: Once | INTRAMUSCULAR | Status: AC
  Administered 2013-09-01: 1.6 mg via INTRAVENOUS
  Filled 2013-09-01: qty 1

## 2013-09-01 MED ORDER — HYDROCODONE-ACETAMINOPHEN 10-500 MG PO TABS: 1.0000 | ORAL_TABLET | Freq: Four times a day (QID) | ORAL | Status: DC | PRN

## 2013-09-01 NOTE — ED Notes (Signed)
Pt given Sprite for fluid challenge.  

## 2013-09-01 NOTE — ED Provider Notes (Signed)
CSN: [id number]     Arrival date & time 09/01/13  1313 History   First MD Initiated Contact with Patient 09/01/13 1319     Chief Complaint  Patient presents with  . Sickle Cell Pain Crisis  . Rectal Bleeding   (Consider location/radiation/quality/duration/timing/severity/associated sxs/prior Treatment) HPI Comments: Patient presents to the ED with a chief complaint of sickle cell pain.  He was released yesterday for the same, following a 6 day hospital admission.  Patient states that he never filled his prescriptions.  He has only tired taking tylenol for his pain.  He states that the pain is mostly in his right leg and low back. Nothing makes his symptoms better or worse.  The history is provided by the patient. No language interpreter was used.    Past Medical History  Diagnosis Date  . Sickle cell anemia   . Immune deficiency disorder   . HIV (human immunodeficiency virus infection)   . Shingles   . Stroke    Past Surgical History  Procedure Laterality Date  . Brain surgery    . Cholecystectomy     Family History  Problem Relation Age of Onset  . Heart disease Father   . Diabetes Father   . Hypertension Maternal Aunt   . Diabetes Maternal Aunt   . Cancer Maternal Grandmother   . Heart disease Mother   . Diabetes Mother    History  Substance Use Topics  . Smoking status: Current Some Day Smoker    Types: Cigarettes  . Smokeless tobacco: Never Used  . Alcohol Use: Yes     Comment: socially    Review of Systems  All other systems reviewed and are negative.    Allergies  Banana; Other; Heparin; Lovenox; Morphine and related; and Adhesive  Home Medications   Current Outpatient Rx  Name  Route  Sig  Dispense  Refill  . aspirin 81 MG chewable tablet   Oral   Chew 81 mg by mouth daily.         . folic acid (FOLVITE) 1 MG tablet   Oral   Take 1 mg by mouth daily.         . promethazine (PHENERGAN) 25 MG tablet   Oral   Take 25 mg by mouth every 6  (six) hours as needed for nausea.         Marland Kitchen elvitegravir-cobicistat-emtricitabine-tenofovir (STRIBILD) 150-150-200-300 MG TABS tablet   Oral   Take 1 tablet by mouth daily.          BP 154/104  Pulse 90  Temp(Src) 98.4 F (36.9 C) (Oral)  Resp 16  SpO2 97% Physical Exam  Nursing note and vitals reviewed. Constitutional: He is oriented to person, place, and time. He appears well-developed and well-nourished.  HENT:  Head: Normocephalic and atraumatic.  Mouth/Throat: No oropharyngeal exudate.  Eyes: Conjunctivae and EOM are normal. Pupils are equal, round, and reactive to light. Right eye exhibits no discharge. Left eye exhibits no discharge. No scleral icterus.  Neck: Normal range of motion. Neck supple. No JVD present.  Cardiovascular: Normal rate, regular rhythm, normal heart sounds and intact distal pulses.  Exam reveals no gallop and no friction rub.   No murmur heard. Pulmonary/Chest: Effort normal and breath sounds normal. No respiratory distress. He has no wheezes. He has no rales. He exhibits no tenderness.  Abdominal: Soft. He exhibits no distension and no mass. There is no tenderness. There is no rebound and no guarding.  Genitourinary:  Occult  blood positive, no BRBPR, no hemorrhage  Musculoskeletal: Normal range of motion. He exhibits no edema and no tenderness.  Neurological: He is alert and oriented to person, place, and time.  Skin: Skin is warm and dry.  Psychiatric: He has a normal mood and affect. His behavior is normal. Judgment and thought content normal.    ED Course  Procedures (including critical care time) Labs Review Labs Reviewed - No data to display Imaging Review No results found.  EKG Interpretation   None       MDM   1. Sickle cell anemia with pain      Patient seen by and discussed with Dr. Effie Shy, who received a phone call from Dr. [name], who recommends transfer to sickle cell clinic for further management and  treatment.   3:33 PM Patient remarks that he has BRBPR.  No hemorrhage on exam, no hemorrhoids, FOBT positive.   3:56 PM Patient discussed with Dr. Effie Shy, will check labs and do a complete workup.    Patient signed out to Sherian Rein, who will continue care.  Roxy Horseman, PA-C 09/01/13 [phone]

## 2013-09-01 NOTE — ED Notes (Signed)
Pt states he was recently discharged yesterday from hospital for sickle cell pain crisis, but pain continues. Pt also states he had an episode of rectal bleeding this am. Denies weakness/dizziness. Pt states pain is in back and leg

## 2013-09-01 NOTE — Telephone Encounter (Signed)
Pt's mother called about Rx for medications was told he had [facility] to [facility] after being discharged from hospital. Pt has been seen [facility] 05/23/13 Pt call back # [phone] cell

## 2013-09-01 NOTE — Progress Notes (Signed)
Pt has not been seen in the clinic since his 1st appointment. Pt is post hospitalization and is being given an prescription for a limited amount of Hydrocodone/ APAP 5/325 mg #60 tabs. He is to make a follow up appointment in order to continue obtaining treatment for his medical conditions. He and his mother have been made aware that failure to keep his appointment will result in no more prescriptions for opiates in the absence of appropriate evaluation of safety and effectiveness of the medication.

## 2013-09-01 NOTE — ED Provider Notes (Signed)
Patient hand off to me by Roxy Horseman, PA-C.  Pt was going to go to [facility] when it was noted he has a small amount of rectal bleeding, hemoccult positive. Basic labs were done for patient, his hemoglobin is baseline for him. Otherwise his labs are unremarkable.  I spoke with Dr. [name] and she recommends giving patient 10 mg Vicodin and having him go to her office in the morning. Discussed this with patient and he says that is fine. He knows he will probably need a blood transfusion in a couple weeks.  27 y.o.Jeremy Mclaughlin's evaluation in the Emergency Department is complete. It has been determined that no acute conditions requiring further emergency intervention are present at this time. The patient/guardian have been advised of the diagnosis and plan. We have discussed signs and symptoms that warrant return to the ED, such as changes or worsening in symptoms.  Vital signs are stable at discharge. Filed Vitals:   09/01/13 1718  BP: 171/94  Pulse: 84  Temp: 98.1 F (36.7 C)  Resp: 18    Patient/guardian has voiced understanding and agreed to follow-up with the PCP or specialist.   Dorthula Matas, PA-C 09/01/13 1728

## 2013-09-01 NOTE — ED Provider Notes (Signed)
  Face-to-face evaluation   History: He presents for evaluation of right leg, and low back pain. He is using Tylenol at home without relief. He was prescribed hydromorphone at discharge yesterday, but has not filled the prescription yet. The pain he is having started, one week ago. He was 8 or 9/10 when he left the hospital yesterday. He also complains of swelling in the right foot, at a site where he has "flea bites." He has a chronic rash of the lower extremities bilaterally. He is due for pheresis, in 2 days, as an outpatient. He gets this procedure every 2 months. He recently saw his HIV doctor, who is contemplating starting new medications. He, states that his CD4 count is greater than 500. He denies fever, chills, cough, shortness of breath, chest pain, nausea, vomiting, weakness, or dizziness   Physical exam: Alert, appears in mild distress. Back tender lumbar without deformity. He's able to sit on the stretcher without significant pain. Extremities minimal swelling of the right ankle. He has an excoriated rash of the lower legs, primarily anteriorly, bilaterally, right greater than left. These rashes are discrete lesions, approximately 3-5 cm, with dried exudate present. There is no central clearing, and no associated fluctuance.  Medical screening examination/treatment/procedure(s) were conducted as a shared visit with non-physician practitioner(s) and myself.  I personally evaluated the patient during the encounter  Flint Melter, MD 09/01/13 1757

## 2013-09-01 NOTE — ED Provider Notes (Addendum)
Medical screening examination/treatment/procedure(s) were conducted as a shared visit with non-physician practitioner(s) and myself.  I personally evaluated the patient during the encounter  Flint Melter, MD 09/01/13 1757  Flint Melter, MD 09/01/13 [phone]

## 2013-09-01 NOTE — ED Notes (Signed)
[facility]ed [facility], told we needed a CBC. Dr. [name] to talk to Dr. Effie Shy.

## 2013-09-06 ENCOUNTER — Telehealth: Payer: Self-pay | Admitting: Internal Medicine

## 2013-09-07 ENCOUNTER — Ambulatory Visit: Payer: Medicaid Other | Admitting: Internal Medicine

## 2013-09-12 ENCOUNTER — Other Ambulatory Visit: Payer: Self-pay | Admitting: *Deleted

## 2013-09-12 DIAGNOSIS — B20 Human immunodeficiency virus [HIV] disease: Secondary | ICD-10-CM

## 2013-09-12 MED ORDER — ELVITEG-COBIC-EMTRICIT-TENOFDF 150-150-200-300 MG PO TABS: 1.0000 | ORAL_TABLET | Freq: Every day | ORAL | Status: DC

## 2013-10-04 ENCOUNTER — Telehealth: Payer: Self-pay | Admitting: Internal Medicine

## 2013-10-10 ENCOUNTER — Other Ambulatory Visit: Payer: Medicaid Other

## 2013-10-17 ENCOUNTER — Ambulatory Visit (INDEPENDENT_AMBULATORY_CARE_PROVIDER_SITE_OTHER): Payer: Medicaid Other | Admitting: Internal Medicine

## 2013-10-17 ENCOUNTER — Encounter: Payer: Self-pay | Admitting: Internal Medicine

## 2013-10-17 VITALS — BP 155/82 | HR 96 | Temp 98.4°F | Ht 65.0 in | Wt 138.0 lb

## 2013-10-17 DIAGNOSIS — B20 Human immunodeficiency virus [HIV] disease: Secondary | ICD-10-CM

## 2013-10-17 NOTE — Progress Notes (Signed)
  Subjective:    Patient ID: Jeremy Mclaughlin, male    DOB: 08/25/1986, 28 y.o.   MRN: [id number]  HPI  Here for follow up. He was initially diagnosed [place] HIV in [place] in 2012.  He then moved to [place] and then later to [place]. [place] since been [place] and I started him on Stribild.  He has been on it for 1 week. He has a history of sickle cell disease with moyamoya and a history of neurovascular surgery with an exacerbation in December. He has establish primary care with the sickle cell physician Dr. Ashley RoyaltyMatthews. He started therapy with stribild one week ago.  He feels well and has not noted any nausea, rashes or issues.  His weight is stable.     Review of Systems  Constitutional: Negative for fever, activity change, appetite change, fatigue and unexpected weight change.  HENT: Negative for trouble swallowing.   Eyes: Negative for visual disturbance.  Respiratory: Negative for cough and shortness of breath.   Cardiovascular: Negative for chest pain and leg swelling.  Gastrointestinal: Negative for nausea, abdominal pain and diarrhea.  Endocrine: Negative for polyuria.  Genitourinary: Negative for hematuria.  Musculoskeletal: Negative for arthralgias.  Skin: Negative for rash.  Neurological: Negative for dizziness, weakness, light-headedness and headaches.  Hematological: Negative for adenopathy.  Psychiatric/Behavioral: Negative for dysphoric mood. The patient is not nervous/anxious.        Objective:   Physical Exam  Constitutional: He appears well-developed and well-nourished. No distress.  HENT:  Mouth/Throat: Oropharynx is clear and moist. No oropharyngeal exudate.  Eyes: Right eye exhibits no discharge. Left eye exhibits no discharge. Scleral icterus is present.  Cardiovascular: Normal rate, regular rhythm and normal heart sounds.   No murmur heard. Pulmonary/Chest: Effort normal and breath sounds normal.  No respiratory distress. He has no wheezes.  Abdominal: Soft. Bowel sounds are normal. He exhibits no distension. There is no tenderness.  Musculoskeletal: Normal range of motion.  Lymphadenopathy:    He has no cervical adenopathy.  Neurological: He is alert.  Skin: Skin is warm and dry. No rash noted.  Psychiatric: He has a normal mood and affect. His behavior is normal.          Assessment & Plan:

## 2013-10-18 NOTE — Assessment & Plan Note (Signed)
Doing well on Stribild.  Labs in 3 weeks and rtc in 4.  Emphasized compliance.

## 2013-10-21 ENCOUNTER — Encounter: Payer: Self-pay | Admitting: *Deleted

## 2013-10-26 ENCOUNTER — Encounter: Payer: Medicaid Other | Admitting: Family Medicine

## 2013-10-26 ENCOUNTER — Telehealth: Payer: Self-pay | Admitting: *Deleted

## 2013-10-26 NOTE — Telephone Encounter (Signed)
The patient is in the hospital in [place] and wanted to ask the doctor if there was something they can give him for extreme nausea. Advised the patient since he is inpatient and has been for 10 days at this point he should have the docter taking care of him call the office and speak with the doctor directly but that we would be glad to do so if he calls.

## 2013-11-12 ENCOUNTER — Inpatient Hospital Stay (HOSPITAL_COMMUNITY)
Admission: EM | Admit: 2013-11-12 | Discharge: 2013-11-15 | DRG: 811 | Disposition: A | Discharge status: Home / Self Care | Payer: Medicaid Other | Attending: Internal Medicine | Admitting: Internal Medicine

## 2013-11-12 ENCOUNTER — Encounter (HOSPITAL_COMMUNITY): Payer: Self-pay | Admitting: Emergency Medicine

## 2013-11-12 DIAGNOSIS — R21 Rash and other nonspecific skin eruption: Secondary | ICD-10-CM

## 2013-11-12 DIAGNOSIS — Z87891 Personal history of nicotine dependence: Secondary | ICD-10-CM

## 2013-11-12 DIAGNOSIS — D5700 Hb-SS disease with crisis, unspecified: Principal | ICD-10-CM | POA: Diagnosis present

## 2013-11-12 DIAGNOSIS — Z79899 Other long term (current) drug therapy: Secondary | ICD-10-CM

## 2013-11-12 DIAGNOSIS — Z7982 Long term (current) use of aspirin: Secondary | ICD-10-CM

## 2013-11-12 DIAGNOSIS — D649 Anemia, unspecified: Secondary | ICD-10-CM | POA: Diagnosis present

## 2013-11-12 DIAGNOSIS — Z8679 Personal history of other diseases of the circulatory system: Secondary | ICD-10-CM

## 2013-11-12 DIAGNOSIS — Z885 Allergy status to narcotic agent status: Secondary | ICD-10-CM

## 2013-11-12 DIAGNOSIS — M7989 Other specified soft tissue disorders: Secondary | ICD-10-CM

## 2013-11-12 DIAGNOSIS — Z8673 Personal history of transient ischemic attack (TIA), and cerebral infarction without residual deficits: Secondary | ICD-10-CM

## 2013-11-12 DIAGNOSIS — B20 Human immunodeficiency virus [HIV] disease: Secondary | ICD-10-CM | POA: Diagnosis present

## 2013-11-12 DIAGNOSIS — M79609 Pain in unspecified limb: Secondary | ICD-10-CM

## 2013-11-12 DIAGNOSIS — L03119 Cellulitis of unspecified part of limb: Secondary | ICD-10-CM

## 2013-11-12 DIAGNOSIS — L02419 Cutaneous abscess of limb, unspecified: Secondary | ICD-10-CM | POA: Diagnosis present

## 2013-11-12 LAB — COMPREHENSIVE METABOLIC PANEL
ALT: 71 U/L — ABNORMAL HIGH (ref 0–53)
AST: 91 U/L — ABNORMAL HIGH (ref 0–37)
Albumin: 3.5 g/dL (ref 3.5–5.2)
Alkaline Phosphatase: 102 U/L (ref 39–117)
BUN: 5 mg/dL — ABNORMAL LOW (ref 6–23)
CHLORIDE: 101 meq/L (ref 96–112)
CO2: 26 meq/L (ref 19–32)
CREATININE: 0.51 mg/dL (ref 0.50–1.35)
Calcium: 8.8 mg/dL (ref 8.4–10.5)
GFR calc Af Amer: >90 mL/min (ref >90)
Glucose, Bld: 99 mg/dL (ref 70–99)
POTASSIUM: 4.1 meq/L (ref 3.7–5.3)
Sodium: 137 mEq/L (ref 137–147)
Total Bilirubin: 2.8 mg/dL — ABNORMAL HIGH (ref 0.3–1.2)
Total Protein: 10.1 g/dL — ABNORMAL HIGH (ref 6.0–8.3)

## 2013-11-12 LAB — CBC WITH DIFFERENTIAL/PLATELET
BASOS ABS: 0.1 10*3/uL (ref 0.0–0.1)
Basophils Relative: 1 % (ref 0–1)
Eosinophils Absolute: 0.2 10*3/uL (ref 0.0–0.7)
Eosinophils Relative: 3 % (ref 0–5)
HCT: 23.3 % — ABNORMAL LOW (ref 39.0–52.0)
Hemoglobin: 7.8 g/dL — ABNORMAL LOW (ref 13.0–17.0)
LYMPHS ABS: 3.5 10*3/uL (ref 0.7–4.0)
Lymphocytes Relative: 43 % (ref 12–46)
MCH: 29.1 pg (ref 26.0–34.0)
MCHC: 33.5 g/dL (ref 30.0–36.0)
MCV: 86.9 fL (ref 78.0–100.0)
MONO ABS: 0.7 10*3/uL (ref 0.1–1.0)
Monocytes Relative: 8 % (ref 3–12)
Neutro Abs: 3.7 10*3/uL (ref 1.7–7.7)
Neutrophils Relative %: 45 % (ref 43–77)
PLATELETS: 348 10*3/uL (ref 150–400)
RBC: 2.68 MIL/uL — AB (ref 4.22–5.81)
RDW: 17.8 % — ABNORMAL HIGH (ref 11.5–15.5)
WBC: 8.2 10*3/uL (ref 4.0–10.5)

## 2013-11-12 MED ORDER — SODIUM CHLORIDE 0.9 % IV SOLN
INTRAVENOUS | Status: DC
  Administered 2013-11-12 – 2013-11-15 (×8): via INTRAVENOUS

## 2013-11-12 MED ORDER — FOLIC ACID 1 MG PO TABS: 1.0000 mg | ORAL_TABLET | Freq: Every day | ORAL | Status: DC

## 2013-11-12 MED ORDER — HYDROMORPHONE HCL PF 2 MG/ML IJ SOLN
2.0000 mg | Freq: Once | INTRAMUSCULAR | Status: AC
  Administered 2013-11-12: 2 mg via INTRAVENOUS
  Filled 2013-11-12: qty 1

## 2013-11-12 MED ORDER — ALPRAZOLAM 0.25 MG PO TABS
0.2500 mg | ORAL_TABLET | Freq: Three times a day (TID) | ORAL | Status: DC
  Administered 2013-11-13 – 2013-11-14 (×4): 0.5 mg via ORAL
  Administered 2013-11-15: 0.25 mg via ORAL
  Filled 2013-11-12 (×2): qty 2
  Filled 2013-11-12 (×2): qty 1
  Filled 2013-11-12 (×3): qty 2

## 2013-11-12 MED ORDER — DIPHENHYDRAMINE HCL 50 MG/ML IJ SOLN: 12.5000 mg | Freq: Four times a day (QID) | INTRAMUSCULAR | Status: DC | PRN

## 2013-11-12 MED ORDER — HYDROMORPHONE 0.3 MG/ML IV SOLN
INTRAVENOUS | Status: DC
  Administered 2013-11-12: 23:00:00 via INTRAVENOUS
  Administered 2013-11-13: 4.73 mg via INTRAVENOUS
  Administered 2013-11-13: 1.2 mg via INTRAVENOUS
  Administered 2013-11-13: 11:00:00 via INTRAVENOUS
  Administered 2013-11-13: 2.67 mg via INTRAVENOUS
  Administered 2013-11-13: 1.8 mg via INTRAVENOUS
  Administered 2013-11-13: 2.6 mg via INTRAVENOUS
  Administered 2013-11-14: 2.7 mg via INTRAVENOUS
  Administered 2013-11-14: 1.5 mg via INTRAVENOUS
  Administered 2013-11-14: 1.2 mg via INTRAVENOUS
  Administered 2013-11-14: 1.3 mg via INTRAVENOUS
  Administered 2013-11-14: 2.64 mg via INTRAVENOUS
  Administered 2013-11-14: 16:00:00 via INTRAVENOUS
  Administered 2013-11-14: 3.9 mg via INTRAVENOUS
  Administered 2013-11-15: 1.2 mg via INTRAVENOUS
  Administered 2013-11-15: 06:00:00 via INTRAVENOUS
  Administered 2013-11-15: 1.5 mg via INTRAVENOUS
  Filled 2013-11-12 (×5): qty 25

## 2013-11-12 MED ORDER — PANTOPRAZOLE SODIUM 40 MG PO TBEC
40.0000 mg | DELAYED_RELEASE_TABLET | Freq: Every day | ORAL | Status: DC
  Administered 2013-11-13 – 2013-11-15 (×2): 40 mg via ORAL
  Filled 2013-11-12 (×3): qty 1

## 2013-11-12 MED ORDER — PANTOPRAZOLE SODIUM 40 MG IV SOLR
40.0000 mg | Freq: Every day | INTRAVENOUS | Status: DC
  Filled 2013-11-12 (×3): qty 40

## 2013-11-12 MED ORDER — ASPIRIN 81 MG PO CHEW
81.0000 mg | CHEWABLE_TABLET | Freq: Every morning | ORAL | Status: DC
  Administered 2013-11-13 – 2013-11-15 (×2): 81 mg via ORAL
  Filled 2013-11-12 (×3): qty 1

## 2013-11-12 MED ORDER — ELVITEG-COBIC-EMTRICIT-TENOFDF 150-150-200-300 MG PO TABS
1.0000 | ORAL_TABLET | Freq: Every day | ORAL | Status: DC
  Administered 2013-11-13 – 2013-11-15 (×3): 1 via ORAL
  Filled 2013-11-12 (×5): qty 1

## 2013-11-12 MED ORDER — SODIUM CHLORIDE 0.9 % IV BOLUS (SEPSIS)
1000.0000 mL | Freq: Once | INTRAVENOUS | Status: AC
  Administered 2013-11-12: 1000 mL via INTRAVENOUS

## 2013-11-12 MED ORDER — NALOXONE HCL 0.4 MG/ML IJ SOLN: 0.4000 mg | INTRAMUSCULAR | Status: DC | PRN

## 2013-11-12 MED ORDER — SODIUM CHLORIDE 0.9 % IJ SOLN: 9.0000 mL | INTRAMUSCULAR | Status: DC | PRN

## 2013-11-12 MED ORDER — DIPHENHYDRAMINE HCL 50 MG/ML IJ SOLN
25.0000 mg | Freq: Once | INTRAMUSCULAR | Status: AC
  Administered 2013-11-12: 25 mg via INTRAVENOUS
  Filled 2013-11-12: qty 1

## 2013-11-12 MED ORDER — DIPHENHYDRAMINE HCL 12.5 MG/5ML PO ELIX: 12.5000 mg | ORAL_SOLUTION | Freq: Four times a day (QID) | ORAL | Status: DC | PRN

## 2013-11-12 MED ORDER — KETOROLAC TROMETHAMINE 15 MG/ML IJ SOLN
30.0000 mg | Freq: Once | INTRAMUSCULAR | Status: AC
  Administered 2013-11-12: 30 mg via INTRAVENOUS
  Filled 2013-11-12: qty 2

## 2013-11-12 MED ORDER — FOLIC ACID 1 MG PO TABS
1.0000 mg | ORAL_TABLET | Freq: Every morning | ORAL | Status: DC
  Administered 2013-11-13 – 2013-11-15 (×2): 1 mg via ORAL
  Filled 2013-11-12 (×3): qty 1

## 2013-11-12 MED ORDER — ONDANSETRON HCL 4 MG PO TABS
4.0000 mg | ORAL_TABLET | ORAL | Status: DC | PRN
  Filled 2013-11-12: qty 1

## 2013-11-12 MED ORDER — AMOXICILLIN-POT CLAVULANATE 875-125 MG PO TABS
1.0000 | ORAL_TABLET | Freq: Two times a day (BID) | ORAL | Status: DC
  Administered 2013-11-12 – 2013-11-15 (×5): 1 via ORAL
  Filled 2013-11-12 (×8): qty 1

## 2013-11-12 MED ORDER — ONDANSETRON HCL 4 MG/2ML IJ SOLN
4.0000 mg | Freq: Once | INTRAMUSCULAR | Status: AC
  Administered 2013-11-12: 4 mg via INTRAVENOUS
  Filled 2013-11-12: qty 2

## 2013-11-12 MED ORDER — ONDANSETRON HCL 4 MG/2ML IJ SOLN
4.0000 mg | INTRAMUSCULAR | Status: DC | PRN
  Administered 2013-11-12 – 2013-11-15 (×7): 4 mg via INTRAVENOUS
  Filled 2013-11-12 (×7): qty 2

## 2013-11-12 MED ORDER — HYDROCODONE-ACETAMINOPHEN 5-325 MG PO TABS: 1.0000 | ORAL_TABLET | ORAL | Status: DC | PRN

## 2013-11-12 MED ORDER — ONDANSETRON HCL 4 MG/2ML IJ SOLN: 4.0000 mg | Freq: Four times a day (QID) | INTRAMUSCULAR | Status: DC | PRN

## 2013-11-12 NOTE — ED Provider Notes (Signed)
CSN: [id number]     Arrival date & time 11/12/13  1711 History   First MD Initiated Contact with Patient 11/12/13 1722     Chief Complaint  Patient presents with  . Sickle Cell Pain Crisis     (Consider location/radiation/quality/duration/timing/severity/associated sxs/prior Treatment) Patient is a 28 y.o. male presenting with sickle cell pain. The history is provided by the patient.  Sickle Cell Pain Crisis Associated symptoms: no chest pain, no headaches, no nausea, no shortness of breath and no vomiting    patient has sickle cell SS. He's had increased pain the last 3 days has been uncontrolled with his hydrocodone at home. No fevers. He had some swelling in his right leg, but states this has been going for a while, but cannot tell how long. A recent exchange transfusion. No fevers. He did not fill antibiotic prescription for sinusitis. He also has a history of HIV. Past Medical History  Diagnosis Date  . Sickle cell anemia   . Immune deficiency disorder   . HIV (human immunodeficiency virus infection)   . Shingles   . Stroke    Past Surgical History  Procedure Laterality Date  . Brain surgery    . Cholecystectomy     Family History  Problem Relation Age of Onset  . Heart disease Father   . Diabetes Father   . Hypertension Maternal Aunt   . Diabetes Maternal Aunt   . Cancer Maternal Grandmother   . Heart disease Mother   . Diabetes Mother    History  Substance Use Topics  . Smoking status: Former Smoker    Types: Cigarettes    Start date: 06/15/2012  . Smokeless tobacco: Never Used  . Alcohol Use: Yes     Comment: socially    Review of Systems  Constitutional: Negative for activity change and appetite change.  Eyes: Negative for pain.  Respiratory: Negative for chest tightness and shortness of breath.   Cardiovascular: Negative for chest pain and leg swelling.  Gastrointestinal: Negative for nausea, vomiting, abdominal pain and diarrhea.  Genitourinary:  Negative for flank pain.  Musculoskeletal: Positive for back pain. Negative for neck stiffness.  Skin: Positive for rash.  Neurological: Negative for weakness, numbness and headaches.  Psychiatric/Behavioral: Negative for behavioral problems.      Allergies  Banana; Other; Heparin; Lovenox; Morphine and related; and Adhesive  Home Medications   Current Outpatient Rx  Name  Route  Sig  Dispense  Refill  . amoxicillin-clavulanate (AUGMENTIN) 875-125 MG per tablet   Oral   Take 1 tablet by mouth 2 (two) times daily. For 14 days         . Ascorbic Acid (VITAMIN C) 1000 MG tablet   Oral   Take 1,000 mg by mouth every other day.         Marland Kitchen aspirin 81 MG chewable tablet   Oral   Chew 81 mg by mouth every morning.          . cetaphil (CETAPHIL) lotion   Topical   Apply 1 application topically 2 (two) times daily.         . clobetasol ointment (TEMOVATE) 0.05 %   Topical   Apply 1 application topically 2 (two) times daily.         Marland Kitchen elvitegravir-cobicistat-emtricitabine-tenofovir (STRIBILD) 150-150-200-300 MG TABS tablet   Oral   Take 1 tablet by mouth daily.   30 tablet   5   . folic acid (FOLVITE) 1 MG tablet   Oral  Take 1 mg by mouth every morning.          Marland Kitchen. HYDROcodone-acetaminophen (LORTAB) 10-500 MG per tablet   Oral   Take 1 tablet by mouth every 6 (six) hours as needed for pain.   20 tablet   0   . paraben-cetyl & stearyl alcohol (CETAPHIL CLEANSER) external solution   Topical   Apply 1 application topically daily.         . promethazine (PHENERGAN) 25 MG tablet   Oral   Take 25 mg by mouth every 6 (six) hours as needed for nausea.         Marland Kitchen. senna (SENOKOT) 8.6 MG TABS tablet   Oral   Take 2 tablets by mouth 2 (two) times daily.          BP 128/76  Pulse 103  Temp(Src) 99 F (37.2 C) (Oral)  Resp 16  SpO2 98% Physical Exam  Nursing note and vitals reviewed. Constitutional: He is oriented to person, place, and time. He appears  well-developed and well-nourished.  HENT:  Head: Normocephalic and atraumatic.  Eyes: EOM are normal. Pupils are equal, round, and reactive to light.  Neck: Normal range of motion. Neck supple.  Cardiovascular: Normal rate, regular rhythm and normal heart sounds.   No murmur heard. Pulmonary/Chest: Effort normal and breath sounds normal.  Abdominal: Soft. Bowel sounds are normal. He exhibits no distension and no mass. There is no tenderness. There is no rebound and no guarding.  Musculoskeletal: Normal range of motion. He exhibits edema.  Edema of right lower leg and foot.  Neurological: He is alert and oriented to person, place, and time. No cranial nerve deficit.  Skin: Skin is warm and dry.  Eczematous rash to bilateral lower extremities.  Psychiatric: He has a normal mood and affect.    ED Course  Procedures (including critical care time) Labs Review Labs Reviewed  CBC WITH DIFFERENTIAL - Abnormal; Notable for the following:    RBC 2.68 (*)    Hemoglobin 7.8 (*)    HCT 23.3 (*)    RDW 17.8 (*)    All other components within normal limits  COMPREHENSIVE METABOLIC PANEL - Abnormal; Notable for the following:    BUN 5 (*)    Total Protein 10.1 (*)    AST 91 (*)    ALT 71 (*)    Total Bilirubin 2.8 (*)    All other components within normal limits   Imaging Review No results found.   EKG Interpretation None      MDM   Final diagnoses:  Sickle cell pain crisis    Patient with sickle cell disease.  he has a pain crisis uncontrolled by his medicines at home. No shortness of breath. Does have some swelling in his right foot. Doppler pending. Patient be admitted to internal medicine    Juliet RudeNathan R. Rubin PayorPickering, MD 11/12/13 2021

## 2013-11-12 NOTE — Progress Notes (Signed)
VASCULAR LAB PRELIMINARY  PRELIMINARY  PRELIMINARY  PRELIMINARY  Right lower extremity venous Doppler completed.    Preliminary report:  There is no DVT or SVT noted in the right lower extremity.  There are multiple enlarged lymph nodes noted in the right groin.  Julias Mould, RVT 11/12/2013, 8:15 PM

## 2013-11-12 NOTE — H&P (Signed)
PCP:  MATTHEWS,MICHELLE A., MD  Hematology [place] at [facility]  Chief Complaint:  Right foot pain  HPI: Jeremy Mclaughlin is a 28 y.o. male   has a past medical history of Sickle cell anemia; Immune deficiency disorder; HIV (human immunodeficiency virus infection); Shingles; and Stroke.   Presented with  3 day hx of pain in right foot and leg that is patient's equivalent of pain crisis. Reports some shortness of breath that is typical for him no fever no chest pain. Hg 7.8. Patient tried to use hydrocodone at home to control his pain but was not sucsessful. Patient has hx of HIV reports taking medicines as prescribed. Reports last CD4 count 564 in January 2015. Per records last CD4 count was done in November 2014 and was750 Doppler of right leg [facility].   Review of Systems:     Pertinent positives include:  Right leg pain and swelling.   Constitutional:  No weight loss, night sweats, Fevers, chills, fatigue, weight loss  HEENT:  No headaches, Difficulty swallowing,Tooth/dental problems,Sore throat,  No sneezing, itching, ear ache, nasal congestion, post nasal drip,  Cardio-vascular:  No chest pain, Orthopnea, PND, anasarca, dizziness, palpitations.no Bilateral lower extremity swelling  GI:  No heartburn, indigestion, abdominal pain, nausea, vomiting, diarrhea, change in bowel habits, loss of appetite, melena, blood in stool, hematemesis Resp:  no shortness of breath at rest. No dyspnea on exertion, No excess mucus, no productive cough, No non-productive cough, No coughing up of blood.No change in color of mucus.No wheezing. Skin:  no rash or lesions. No jaundice GU:  no dysuria, change in color of urine, no urgency or frequency. No straining to urinate.  No flank pain.  Musculoskeletal:  No joint pain or no joint swelling. No decreased range of motion. No back pain.  Psych:  No change in mood or affect. No depression or  anxiety. No memory loss.  Neuro: no localizing neurological complaints, no tingling, no weakness, no double vision, no gait abnormality, no slurred speech, no confusion  Otherwise ROS are negative except for above, 10 systems were reviewed  Past Medical History: Past Medical History  Diagnosis Date  . Sickle cell anemia   . Immune deficiency disorder   . HIV (human immunodeficiency virus infection)   . Shingles   . Stroke    Past Surgical History  Procedure Laterality Date  . Brain surgery    . Cholecystectomy       Medications: Prior to Admission medications   Medication Sig Start Date End Date Taking? Authorizing Provider  amoxicillin-clavulanate (AUGMENTIN) 875-125 MG per tablet Take 1 tablet by mouth 2 (two) times daily. For 14 days   Yes Historical Provider, MD  Ascorbic Acid (VITAMIN C) 1000 MG tablet Take 1,000 mg by mouth every other day.   Yes Historical Provider, MD  aspirin 81 MG chewable tablet Chew 81 mg by mouth every morning.    Yes Historical Provider, MD  cetaphil (CETAPHIL) lotion Apply 1 application topically 2 (two) times daily.   Yes Historical Provider, MD  clobetasol ointment (TEMOVATE) 0.05 % Apply 1 application topically 2 (two) times daily.   Yes Historical Provider, MD  elvitegravir-cobicistat-emtricitabine-tenofovir (STRIBILD) 150-150-200-300 MG TABS tablet Take 1 tablet by mouth daily. 09/12/13  Yes Gardiner Barefoot, MD  folic acid (FOLVITE) 1 MG tablet Take 1 mg by mouth every morning.    Yes Historical Provider, MD  HYDROcodone-acetaminophen (LORTAB) 10-500 MG per tablet Take 1  tablet by mouth every 6 (six) hours as needed for pain. 09/01/13  Yes Tiffany Irine Seal, PA-C  paraben-cetyl & stearyl alcohol (CETAPHIL CLEANSER) external solution Apply 1 application topically daily.   Yes Historical Provider, MD  promethazine (PHENERGAN) 25 MG tablet Take 25 mg by mouth every 6 (six) hours as needed for nausea. 05/18/13  Yes Alison Murray, MD  senna (SENOKOT) 8.6  MG TABS tablet Take 2 tablets by mouth 2 (two) times daily.   Yes Historical Provider, MD    Allergies:   Allergies  Allergen Reactions  . Banana Anaphylaxis  . Other Anaphylaxis and Rash    melons  . Heparin Hives  . Lovenox [Enoxaparin Sodium] Hives  . Morphine And Related Hives and Itching    Doesn't work for pt  . Adhesive [Tape] Rash    Social History:  Ambulatory independently   Lives at  Home with family   reports that he has quit smoking. His smoking use included Cigarettes. He started smoking about 16 months ago. He smoked 0.00 packs per day. He has never used smokeless tobacco. He reports that he drinks alcohol. He reports that he does not use illicit drugs.   Family History: family history includes Cancer in his maternal grandmother; Diabetes in his father, maternal aunt, and mother; Heart disease in his father and mother; Hypertension in his maternal aunt.    Physical Exam: Patient Vitals for the past 24 hrs:  BP Temp Temp src Pulse Resp SpO2  11/12/13 1716 128/76 mmHg 99 F (37.2 C) Oral 103 16 98 %    1. General:  in No Acute distress 2. Psychological: Alert and   Oriented 3. Head/ENT:     Dry Mucous Membranes                          Head Non traumatic, neck supple                          Normal  Dentition 4. SKIN: normal    Skin turgor,  Skin clean Dry and intact, scarring over bilateral low extremity presents from prior fleabites. Chronic lichenification and irritation of right ankle noted. Patient states he had a long history of rash affecting the ankles post steroid cream for this in the past but failed to fill the prescription. 5. Heart: Regular rate and rhythm no Murmur, Rub or gallop 6. Lungs: Clear to auscultation bilaterally, no wheezes or crackles   7. Abdomen: Soft, non-tender, Non distended 8. Lower extremities: no clubbing, cyanosis, right foot edema 9. Neurologically Grossly intact, moving all 4 extremities equally 10. MSK: Normal range of  motion  body mass index is unknown because there is no weight on file.   Labs on Admission:   Recent Labs  11/12/13 1737  NA 137  K 4.1  CL 101  CO2 26  GLUCOSE 99  BUN 5*  CREATININE 0.51  CALCIUM 8.8    Recent Labs  11/12/13 1737  AST 91*  ALT 71*  ALKPHOS 102  BILITOT 2.8*  PROT 10.1*  ALBUMIN 3.5   No results found for this basename: LIPASE, AMYLASE,  in the last 72 hours  Recent Labs  11/12/13 1737  WBC 8.2  NEUTROABS 3.7  HGB 7.8*  HCT 23.3*  MCV 86.9  PLT 348    Cultures:    Component Value Date/Time   SDES LEG 08/28/2013 1008   SPECREQUEST Immunocompromised  08/28/2013 1008   CULT  Value: MODERATE STAPHYLOCOCCUS AUREUS Note: RIFAMPIN AND GENTAMICIN SHOULD NOT BE USED AS SINGLE DRUGS FOR TREATMENT OF STAPH INFECTIONS. ABUNDANT STREPTOCOCCUS GROUP G Performed at [facility] 08/28/2013 1008   REPTSTATUS 08/31/2013 FINAL 08/28/2013 1008       Radiological Exams on Admission: No results found.  Chart has been reviewed  Assessment/Plan  28 yo male history sickle cell and HIV here with sickle cell crisis  Present on Admission:  . Sickle cell pain crisis - admit for pain control, PCA Dilaudid, IV fluids. Right lower extremity pain states typical for his pain crisis negative for DVT  . Human immunodeficiency virus (HIV) disease - continue home medications would lead ID not the patient in house to see if they need any additional tests at this time  . Anemia - no indication for transfusion at this time    Prophylaxis: SCD  CODE STATUS: FULL CODE  Other plan as per orders.  I have spent a total of 45 min on this admission  Dhillon [place] 11/12/2013, 8:37 PM

## 2013-11-12 NOTE — ED Notes (Signed)
Attempted to call report will wait for nurse to return call

## 2013-11-12 NOTE — ED Notes (Addendum)
Pt reports having a sickle cell pain crisis that began three days ago. Pt reports pain in his right foot and leg. Pt reports intermittent pain on the left leg. Pt reports taking hydrocodone at home, however reports this has not improved his pain.

## 2013-11-13 ENCOUNTER — Inpatient Hospital Stay (HOSPITAL_COMMUNITY): Payer: Medicaid Other

## 2013-11-13 DIAGNOSIS — D649 Anemia, unspecified: Secondary | ICD-10-CM

## 2013-11-13 DIAGNOSIS — D5700 Hb-SS disease with crisis, unspecified: Secondary | ICD-10-CM

## 2013-11-13 DIAGNOSIS — B20 Human immunodeficiency virus [HIV] disease: Secondary | ICD-10-CM

## 2013-11-13 LAB — CBC WITH DIFFERENTIAL/PLATELET
BASOS PCT: 1 % (ref 0–1)
Basophils Absolute: 0.1 10*3/uL (ref 0.0–0.1)
EOS ABS: 0.3 10*3/uL (ref 0.0–0.7)
Eosinophils Relative: 3 % (ref 0–5)
HEMATOCRIT: 20.6 % — AB (ref 39.0–52.0)
Hemoglobin: 6.9 g/dL — CL (ref 13.0–17.0)
Lymphocytes Relative: 34 % (ref 12–46)
Lymphs Abs: 3.2 10*3/uL (ref 0.7–4.0)
MCH: 29 pg (ref 26.0–34.0)
MCHC: 33.5 g/dL (ref 30.0–36.0)
MCV: 86.6 fL (ref 78.0–100.0)
Monocytes Absolute: 0.9 10*3/uL (ref 0.1–1.0)
Monocytes Relative: 10 % (ref 3–12)
NEUTROS ABS: 4.9 10*3/uL (ref 1.7–7.7)
Neutrophils Relative %: 52 % (ref 43–77)
Platelets: 336 10*3/uL (ref 150–400)
RBC: 2.38 MIL/uL — AB (ref 4.22–5.81)
RDW: 18.2 % — ABNORMAL HIGH (ref 11.5–15.5)
WBC: 9.4 10*3/uL (ref 4.0–10.5)

## 2013-11-13 LAB — RETICULOCYTES
RBC.: 2.38 MIL/uL — ABNORMAL LOW (ref 4.22–5.81)
RETIC COUNT ABSOLUTE: 154.7 10*3/uL (ref 19.0–186.0)
RETIC CT PCT: 6.5 % — AB (ref 0.4–3.1)

## 2013-11-13 NOTE — Progress Notes (Signed)
Called ED for report, spoke with nurse who took my phone number, and stated she would call me back as soon as she was able to.

## 2013-11-13 NOTE — Progress Notes (Signed)
Subjective: Patient has 8/10 pain despite using his PCA pump. He has pain in his lower extremity near his graft. Has been unable to get out of bed. He has been having some chills but no fever.  Objective: Vital signs in last 24 hours: Temp:  [98.2 F (36.8 C)-99 F (37.2 C)] 98.4 F (36.9 C) (03/01 0435) Pulse Rate:  [87-103] 87 (03/01 0435) Resp:  [11-18] 11 (03/01 0800) BP: (128-137)/(76-88) 132/83 mmHg (03/01 0435) SpO2:  [94 %-100 %] 100 % (03/01 0800) FiO2 (%):  [28 %] 28 % (03/01 0800) Weight:  [62.9 kg (138 lb 10.7 oz)] 62.9 kg (138 lb 10.7 oz) (02/28 2233) Weight change:  Last BM Date: 11/12/13  Intake/Output from previous day: 02/28 0701 - 03/01 0700 In: 420 [P.O.:420] Out: -  Intake/Output this shift: Total I/O In: -  Out: 875 [Urine:875]  General appearance: alert, cooperative and no distress Eyes: conjunctivae/corneas clear. PERRL, EOM's intact. Fundi benign. Throat: lips, mucosa, and tongue normal; teeth and gums normal Neck: no adenopathy, no carotid bruit, no JVD, supple, symmetrical, trachea midline and thyroid not enlarged, symmetric, no tenderness/mass/nodules Back: symmetric, no curvature. ROM normal. No CVA tenderness. Resp: clear to auscultation bilaterally Chest wall: no tenderness Cardio: regular rate and rhythm, S1, S2 normal, no murmur, click, rub or gallop GI: soft, non-tender; bowel sounds normal; no masses,  no organomegaly Extremities: extremities normal, atraumatic, no cyanosis or edema Pulses: 2+ and symmetric Skin: Skin color, texture, turgor normal. No rashes or lesions Neurologic: Grossly normal  Lab Results:  Recent Labs  11/12/13 1737 11/13/13 0411  WBC 8.2 9.4  HGB 7.8* 6.9*  HCT 23.3* 20.6*  PLT 348 336   BMET  Recent Labs  11/12/13 1737  NA 137  K 4.1  CL 101  CO2 26  GLUCOSE 99  BUN 5*  CREATININE 0.51  CALCIUM 8.8    Studies/Results: Dg Chest 2 View  11/13/2013   CLINICAL DATA:  Sickle cell pain crisis, chest  pain  EXAM: CHEST  2 VIEW  COMPARISON:  CT ANGIO CHEST W/CM &/OR WO/CM dated 08/25/2013; DG CHEST 2 VIEW dated 08/25/2013; DG CHEST 2 VIEW dated 03/08/2013  FINDINGS: Grossly unchanged enlarged cardiac silhouette. Normal mediastinal contours. Interval development of small bilateral effusions and associated bibasilar heterogeneous opacities. There is pleural parenchymal thickening about the bilateral major in the right [place] fissures. No definite evidence of edema. No pneumothorax. Unchanged bones. Post cholecystectomy.  IMPRESSION: 1. Interval development small bilateral effusions and associated bibasilar opacities, atelectasis versus infiltrate. 2. Cardiomegaly without definite evidence of edema.   Electronically Signed   By: Simonne ComeJohn  Watts M.D.   On: 11/13/2013 11:28    Medications: I have reviewed the patient's current medications.  Assessment/Plan: A 28 yo admitted with Sickle cell Painful crisis and Anemia.  #1 Sickle cell painful crisis: Will continue Dilaudid PCA, Toradol, and supportive care.  #2 HIV Disease: Will continue per ID.  #3 Sicle cell Anemia: H/H has dropped from 7.8 to 6.9. Baseline is usually around 7.4.  #4 Right Lower Extremity Cellulitis: Continue antibiotics.  LOS: 1 day   Jilda Kress,LAWAL 11/13/2013, 11:36 AM

## 2013-11-13 NOTE — Progress Notes (Signed)
Patient's mother requesting case management consult, states patient in need of new pcp and oral surgeon for tooth extraction.  Mother also states they have recently moved into this state and the patient's medicaid is attached to the wrong county and patient cannot get medical transport as a result.  Family request information on how this can be corrected.

## 2013-11-13 NOTE — Progress Notes (Signed)
CRITICAL VALUE ALERT  Critical value received:hgb 6.9  Date of notification:  11/13/13  Time of notification:  0515  Critical value read back:yes  Nurse who received alert:  Hedda SladeJennifer Edger Husain  MD notified (1st page):  hospitalist on call  Time of first page:  [phone]  MD notified (2nd page):  Time of second page:  Responding MD:  Burnadette PeterLynch  Time MD responded:  [phone]

## 2013-11-14 DIAGNOSIS — D5700 Hb-SS disease with crisis, unspecified: Principal | ICD-10-CM

## 2013-11-14 LAB — CBC WITH DIFFERENTIAL/PLATELET
Basophils Absolute: 0.1 10*3/uL (ref 0.0–0.1)
Basophils Relative: 1 % (ref 0–1)
EOS ABS: 0.5 10*3/uL (ref 0.0–0.7)
EOS PCT: 4 % (ref 0–5)
HCT: 21.6 % — ABNORMAL LOW (ref 39.0–52.0)
Hemoglobin: 7.1 g/dL — ABNORMAL LOW (ref 13.0–17.0)
LYMPHS ABS: 2.8 10*3/uL (ref 0.7–4.0)
Lymphocytes Relative: 26 % (ref 12–46)
MCH: 28.9 pg (ref 26.0–34.0)
MCHC: 32.9 g/dL (ref 30.0–36.0)
MCV: 87.8 fL (ref 78.0–100.0)
Monocytes Absolute: 0.9 10*3/uL (ref 0.1–1.0)
Monocytes Relative: 8 % (ref 3–12)
Neutro Abs: 6.8 10*3/uL (ref 1.7–7.7)
Neutrophils Relative %: 62 % (ref 43–77)
PLATELETS: 322 10*3/uL (ref 150–400)
RBC: 2.46 MIL/uL — AB (ref 4.22–5.81)
RDW: 18.2 % — ABNORMAL HIGH (ref 11.5–15.5)
WBC: 11 10*3/uL — ABNORMAL HIGH (ref 4.0–10.5)

## 2013-11-14 LAB — COMPREHENSIVE METABOLIC PANEL
ALT: 68 U/L — ABNORMAL HIGH (ref 0–53)
AST: 66 U/L — ABNORMAL HIGH (ref 0–37)
Albumin: 3 g/dL — ABNORMAL LOW (ref 3.5–5.2)
Alkaline Phosphatase: 131 U/L — ABNORMAL HIGH (ref 39–117)
BUN: 5 mg/dL — ABNORMAL LOW (ref 6–23)
CALCIUM: 8.9 mg/dL (ref 8.4–10.5)
CO2: 27 mEq/L (ref 19–32)
Chloride: 100 mEq/L (ref 96–112)
Creatinine, Ser: 0.43 mg/dL — ABNORMAL LOW (ref 0.50–1.35)
GFR calc non Af Amer: >90 mL/min (ref >90)
GLUCOSE: 82 mg/dL (ref 70–99)
Potassium: 3.8 mEq/L (ref 3.7–5.3)
SODIUM: 136 meq/L — AB (ref 137–147)
Total Bilirubin: 4.1 mg/dL — ABNORMAL HIGH (ref 0.3–1.2)
Total Protein: 9 g/dL — ABNORMAL HIGH (ref 6.0–8.3)

## 2013-11-14 MED ORDER — PROMETHAZINE HCL 25 MG/ML IJ SOLN
25.0000 mg | Freq: Four times a day (QID) | INTRAMUSCULAR | Status: DC | PRN
  Administered 2013-11-14 – 2013-11-15 (×2): 25 mg via INTRAVENOUS
  Filled 2013-11-14 (×2): qty 1

## 2013-11-14 MED ORDER — DIPHENHYDRAMINE HCL 25 MG PO CAPS
25.0000 mg | ORAL_CAPSULE | Freq: Four times a day (QID) | ORAL | Status: DC | PRN
  Administered 2013-11-14 – 2013-11-15 (×2): 25 mg via ORAL
  Filled 2013-11-14 (×2): qty 1

## 2013-11-14 NOTE — Progress Notes (Signed)
Subjective: A 28 yo man admitted with sickle cell painful crisis has used 12.29 mg in 24 hours with 44 demands and 41 deliveries. Still complaining of 9/10 pain in his legs. No fever, no NVD. He has been using his PCA he says but not using maximum allowed.   Objective: Vital signs in last 24 hours: Temp:  [97.8 F (36.6 C)-99.2 F (37.3 C)] 98.1 F (36.7 C) (03/02 1441) Pulse Rate:  [73-99] 73 (03/02 1441) Resp:  [12-16] 16 (03/02 1441) BP: (128-144)/(76-93) 137/89 mmHg (03/02 1441) SpO2:  [98 %-100 %] 100 % (03/02 1441) FiO2 (%):  [28 %] 28 % (03/02 1159) Weight change:  Last BM Date: 11/12/13  Intake/Output from previous day: 03/01 0701 - 03/02 0700 In: 4492.5 [P.O.:960; I.V.:3532.5] Out: 3975 [Urine:3775; Emesis/NG output:200] Intake/Output this shift: Total I/O In: 1377.5 [I.V.:1377.5] Out: 400 [Urine:400]  General appearance: alert, cooperative and no distress Eyes: conjunctivae/corneas clear. PERRL, EOM's intact. Fundi benign. Neck: no adenopathy, no carotid bruit, no JVD, supple, symmetrical, trachea midline and thyroid not enlarged, symmetric, no tenderness/mass/nodules Back: symmetric, no curvature. ROM normal. No CVA tenderness. Resp: clear to auscultation bilaterally Chest wall: no tenderness Cardio: regular rate and rhythm, S1, S2 normal, no murmur, click, rub or gallop and normal apical impulse GI: soft, non-tender; bowel sounds normal; no masses,  no organomegaly Extremities: Has right lower leg graft looks infected and raised. Pulses: 2+ and symmetric Skin: Skin color, texture, turgor normal. No rashes or lesions or excoriation - lower leg(s) right, hyperpigmentation - lower leg(s) right and macular - lower leg(s) right Neurologic: Grossly normal  Lab Results:  Recent Labs  11/13/13 0411 11/14/13 0355  WBC 9.4 11.0*  HGB 6.9* 7.1*  HCT 20.6* 21.6*  PLT 336 322   BMET  Recent Labs  11/12/13 1737 11/14/13 0355  NA 137 136*  K 4.1 3.8  CL 101 100   CO2 26 27  GLUCOSE 99 82  BUN 5* 5*  CREATININE 0.51 0.43*  CALCIUM 8.8 8.9    Studies/Results: Dg Chest 2 View  11/13/2013   CLINICAL DATA:  Sickle cell pain crisis, chest pain  EXAM: CHEST  2 VIEW  COMPARISON:  CT ANGIO CHEST W/CM &/OR WO/CM dated 08/25/2013; DG CHEST 2 VIEW dated 08/25/2013; DG CHEST 2 VIEW dated 03/08/2013  FINDINGS: Grossly unchanged enlarged cardiac silhouette. Normal mediastinal contours. Interval development of small bilateral effusions and associated bibasilar heterogeneous opacities. There is pleural parenchymal thickening about the bilateral major in the right [place] fissures. No definite evidence of edema. No pneumothorax. Unchanged bones. Post cholecystectomy.  IMPRESSION: 1. Interval development small bilateral effusions and associated bibasilar opacities, atelectasis versus infiltrate. 2. Cardiomegaly without definite evidence of edema.   Electronically Signed   By: Simonne ComeJohn  Watts M.D.   On: 11/13/2013 11:28    Medications: I have reviewed the patient's current medications.  Assessment/Plan: A 28 yo with sickle cell painful crisis as well as graft infection/inflammation.  #1 Sickle Cell Painful Crisis: Seems not using enough of his PCA. Will encourage him to use it more so his pain will be better controlled. No change today.  #2 HIV Disease: Stable  #3 Skin lesion/Cellulitis: Continue antibiotics.  #4 Sickle cell Anemia: H/H stable now.   LOS: 2 days   Jakhia Buxton,LAWAL 11/14/2013, 3:12 PM

## 2013-11-14 NOTE — Care Management Note (Signed)
Cm spoke with patient at bedside concerning Cm consult for PCP. Pt provided with [place] physicians [place]. Pt instructed to contact Medicaid worker to change PCP on Medicaid card prior to scheduling a new appt with a new PCP. Pt informed to contact dentist who accepts Medicaid for a referral to an oral surgeon. Cm attempted to contact pt's mother with information provided to patient. Vm left.   Roxy Mannsymeeka Zaynah Chawla,MSN,RN [phone]

## 2013-11-15 DIAGNOSIS — Z8679 Personal history of other diseases of the circulatory system: Secondary | ICD-10-CM

## 2013-11-15 LAB — CBC WITH DIFFERENTIAL/PLATELET
Basophils Absolute: 0.1 10*3/uL (ref 0.0–0.1)
Basophils Relative: 1 % (ref 0–1)
Eosinophils Absolute: 0.4 10*3/uL (ref 0.0–0.7)
Eosinophils Relative: 5 % (ref 0–5)
HEMATOCRIT: 22.8 % — AB (ref 39.0–52.0)
Hemoglobin: 7.6 g/dL — ABNORMAL LOW (ref 13.0–17.0)
Lymphocytes Relative: 35 % (ref 12–46)
Lymphs Abs: 2.4 10*3/uL (ref 0.7–4.0)
MCH: 29.1 pg (ref 26.0–34.0)
MCHC: 33.3 g/dL (ref 30.0–36.0)
MCV: 87.4 fL (ref 78.0–100.0)
MONO ABS: 0.6 10*3/uL (ref 0.1–1.0)
Monocytes Relative: 9 % (ref 3–12)
Neutro Abs: 3.4 10*3/uL (ref 1.7–7.7)
Neutrophils Relative %: 50 % (ref 43–77)
Platelets: 339 10*3/uL (ref 150–400)
RBC: 2.61 MIL/uL — ABNORMAL LOW (ref 4.22–5.81)
RDW: 18 % — ABNORMAL HIGH (ref 11.5–15.5)
WBC: 6.8 10*3/uL (ref 4.0–10.5)

## 2013-11-15 LAB — COMPREHENSIVE METABOLIC PANEL
ALT: 61 U/L — ABNORMAL HIGH (ref 0–53)
AST: 60 U/L — ABNORMAL HIGH (ref 0–37)
Albumin: 3 g/dL — ABNORMAL LOW (ref 3.5–5.2)
Alkaline Phosphatase: 138 U/L — ABNORMAL HIGH (ref 39–117)
BILIRUBIN TOTAL: 3.8 mg/dL — AB (ref 0.3–1.2)
BUN: 5 mg/dL — ABNORMAL LOW (ref 6–23)
CO2: 29 meq/L (ref 19–32)
CREATININE: 0.42 mg/dL — AB (ref 0.50–1.35)
Calcium: 8.9 mg/dL (ref 8.4–10.5)
Chloride: 100 mEq/L (ref 96–112)
GFR calc Af Amer: >90 mL/min (ref >90)
Glucose, Bld: 88 mg/dL (ref 70–99)
Potassium: 3.7 mEq/L (ref 3.7–5.3)
Sodium: 137 mEq/L (ref 137–147)
Total Protein: 8.6 g/dL — ABNORMAL HIGH (ref 6.0–8.3)

## 2013-11-15 MED ORDER — CAMPHOR-MENTHOL 0.5-0.5 % EX LOTN
TOPICAL_LOTION | CUTANEOUS | Status: DC | PRN
  Administered 2013-11-15: 15:00:00 via TOPICAL
  Filled 2013-11-15: qty 222

## 2013-11-15 MED ORDER — HYDROCODONE-ACETAMINOPHEN 10-325 MG PO TABS
1.0000 | ORAL_TABLET | ORAL | Status: DC
  Filled 2013-11-15: qty 1

## 2013-11-15 NOTE — Progress Notes (Signed)
PCA Dilaudid order discontinued. 10ml of Dilaudid wasted in sink. Witnessed by Charlynne Panderegina Baldwin, RN.

## 2013-11-15 NOTE — Progress Notes (Signed)
Pt discharged home in stable condition. Discharge instructions given. Pt verbalized understanding.  

## 2013-11-15 NOTE — Discharge Summary (Signed)
Jeremy Mclaughlin MRN: [id number] DOB/AGE: 12-10-1985 28 y.o.  Admit date: 11/12/2013 Discharge date: 11/15/2013  Primary Care Physician:  Sabrina Keough A., MD   Discharge Diagnoses:   Patient Active Problem List   Diagnosis Date Noted  . Sickle cell disease with crisis 11/12/2013  . Hypokalemia 08/30/2013  . Hypomagnesemia 08/30/2013  . Impetigo 08/28/2013  . Scleral icterus 08/28/2013  . Hyperbilirubinemia 08/26/2013  . Sickle cell anemia with crisis 08/26/2013  . Sickle cell crisis 08/26/2013  . 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 Leukocytosis, unspecified 03/09/2013  . Sickle cell pain crisis 03/08/2013  . History of Moyamoya syndrome 03/08/2013  . Human immunodeficiency virus (HIV) disease 03/08/2013  . Anemia 03/08/2013    DISCHARGE MEDICATION:   Medication List         amoxicillin-clavulanate 875-125 MG per tablet  Commonly known as:  AUGMENTIN  Take 1 tablet by mouth 2 (two) times daily. For 14 days     aspirin 81 MG chewable tablet  Chew 81 mg by mouth every morning.     cetaphil lotion  Apply 1 application topically 2 (two) times daily.     clobetasol ointment 0.05 %  Commonly known as:  TEMOVATE  Apply 1 application topically 2 (two) times daily.     elvitegravir-cobicistat-emtricitabine-tenofovir 150-150-200-300 MG Tabs tablet  Commonly known as:  STRIBILD  Take 1 tablet by mouth daily.     folic acid 1 MG tablet  Commonly known as:  FOLVITE  Take 1 mg by mouth every morning.     HYDROcodone-acetaminophen 10-500 MG per tablet  Commonly known as:  LORTAB  Take 1 tablet by mouth every 6 (six) hours as needed for pain.     paraben-cetyl & stearyl alcohol external solution  Apply 1 application topically daily.     promethazine 25 MG tablet  Commonly known as:  PHENERGAN  Take 25 mg by mouth every 6 (six) hours as needed for nausea.     senna 8.6 MG Tabs tablet  Commonly known as:  SENOKOT  Take 2 tablets by mouth 2 (two) times daily.     vitamin C  1000 MG tablet  Take 1,000 mg by mouth every other day.          Consults: Treatment Team:  Altha Harm, MD Gardiner Barefoot, MD   SIGNIFICANT DIAGNOSTIC STUDIES:  Dg Chest 2 View  11/13/2013   CLINICAL DATA:  Sickle cell pain crisis, chest pain  EXAM: CHEST  2 VIEW  COMPARISON:  CT ANGIO CHEST W/CM &/OR WO/CM dated 08/25/2013; DG CHEST 2 VIEW dated 08/25/2013; DG CHEST 2 VIEW dated 03/08/2013  FINDINGS: Grossly unchanged enlarged cardiac silhouette. Normal mediastinal contours. Interval development of small bilateral effusions and associated bibasilar heterogeneous opacities. There is pleural parenchymal thickening about the bilateral major in the right [place] fissures. No definite evidence of edema. No pneumothorax. Unchanged bones. Post cholecystectomy.  IMPRESSION: 1. Interval development small bilateral effusions and associated bibasilar opacities, atelectasis versus infiltrate. 2. Cardiomegaly without definite evidence of edema.   Electronically Signed   By: Simonne Come M.D.   On: 11/13/2013 11:28        No results found for this or any previous visit (from the past 240 hour(s)).  BRIEF ADMITTING H & P: Jeremy Mclaughlin is a 28 y.o. Jeremy has a past medical history of Sickle cell anemia; Immune deficiency disorder; HIV (human immunodeficiency virus infection); Shingles; and Stroke.  Presented with a 3 day hx of pain in right foot and leg  that is patient's equivalent of pain crisis. Reports some shortness of breath that is typical for him no fever no chest pain. Hg 7.8. Patient tried to use hydrocodone at home to control his pain but was not sucsessful. Patient has hx of HIV reports taking medicines as prescribed. Reports last CD4 count 564 in January 2015. Per records last CD4 count was done in November 2014 and was750  Doppler of right leg negative for DVT in ER         Hospital Course:  Present on Admission:  . Sickle cell pain crisis: Pt was treated for acute pain crisis  withToradol, IVF and  Dilaudid PCA His pain improved and he was transitioned to scheduled oral analgesics and then on a PRN basis. Pt is discharged home on his pre-hospital analgesics. [facility] where he also has Red Cell Pheresis every 6-8 weeks for stroke prophylaxis. He is to follow-up with his Provider in 3-5 days. . Human immunodeficiency virus (HIV) disease: Pt was continued on HAART therapy while hospitalized. He follows with Dr. Luciana Axeomer at the ID clinic and is scheduled for a visit on 12/06/3013. Marland Kitchen. Sinusitis: Pt was being treated by Adair Laundryavid Reedy for Sinusitis prior to hospitalization. He is currently on a course of Augmentin and is to complete as directed by Mr. Reedy. . Dry Skin: Pt has dry skin and has secondary skin changes d/t itching, He has been advised to keep skin moist and well hydrated  Disposition and Follow-up:  Pt stable at discharge. He is to follow up with Mr. Darlys GalesReedy as scheduled and also with Dr. Luciana Axeomer on 12/06/3013.     Discharge Orders   Future Appointments Provider Department Dept Phone   11/21/2013 2:15 PM [facility] [phone]   12/06/2013 10:30 AM Gardiner Barefootobert W Comer, MD [facility] [phone]   Future Orders Complete By Expires   Activity as tolerated - No restrictions  As directed    Diet general  As directed       DISCHARGE EXAM:  General: Alert, awake, oriented x3, in no acute distress.  Vital Signs: BP 138/86, HR 76, T 98.1 F (36.7 C), temperature source Oral, RR 14, height 5\' 5"  (1.651 m), weight 138 lb 10.7 oz (62.9 kg), SpO2 100.00%. HEENT: [place]/AT PEERL, EOMI, anicteric  Neck: Trachea midline, no masses, no thyromegal,y no JVD, no carotid bruit  OROPHARYNX: Moist, No exudate/ erythema/lesions.  Heart: Tachycardic with regular rhythm, without murmurs, rubs, gallops.  Lungs: Clear to auscultation, no wheezing  or rhonchi noted.  Abdomen: Soft, nontender, nondistended, positive bowel sounds, no masses no hepatosplenomegaly noted.  Neuro: No focal neurological deficits noted cranial nerves II through XII grossly intact. Strength functional in bilateral upper and lower extremities.  Musculoskeletal: No warm swelling or erythema around joints, no spinal tenderness noted.  Psychiatric: Patient alert and oriented x3, good insight and cognition, good recent to remote recall    Recent Labs  11/14/13 0355 11/15/13 0421  NA 136* 137  K 3.8 3.7  CL 100 100  CO2 27 29  GLUCOSE 82 88  BUN 5* 5*  CREATININE 0.43* 0.42*  CALCIUM 8.9 8.9    Recent Labs  11/14/13 0355 11/15/13 0421  AST 66* 60*  ALT 68* 61*  ALKPHOS 131* 138*  BILITOT 4.1* 3.8*  PROT 9.0* 8.6*  ALBUMIN 3.0* 3.0*   No results found for this  basename: LIPASE, AMYLASE,  in the last 72 hours  Recent Labs  11/14/13 0355 11/15/13 0421  WBC 11.0* 6.8  NEUTROABS 6.8 3.4  HGB 7.1* 7.6*  HCT 21.6* 22.8*  MCV 87.8 87.4  PLT 322 339   Total time for discharge including decision making and face to face was greater than 30 minutes. Signed: Richardson Dubree A. 11/15/2013, 3:40 PM

## 2013-11-19 ENCOUNTER — Encounter (HOSPITAL_COMMUNITY): Payer: Self-pay | Admitting: Emergency Medicine

## 2013-11-19 ENCOUNTER — Inpatient Hospital Stay (HOSPITAL_COMMUNITY)
Admission: EM | Admit: 2013-11-19 | Discharge: 2013-11-22 | DRG: 811 | Disposition: A | Discharge status: Home / Self Care | Payer: Medicaid Other | Attending: Internal Medicine | Admitting: Internal Medicine

## 2013-11-19 ENCOUNTER — Emergency Department (HOSPITAL_COMMUNITY): Payer: Medicaid Other

## 2013-11-19 DIAGNOSIS — D5700 Hb-SS disease with crisis, unspecified: Principal | ICD-10-CM | POA: Diagnosis present

## 2013-11-19 DIAGNOSIS — Z833 Family history of diabetes mellitus: Secondary | ICD-10-CM

## 2013-11-19 DIAGNOSIS — D649 Anemia, unspecified: Secondary | ICD-10-CM

## 2013-11-19 DIAGNOSIS — L408 Other psoriasis: Secondary | ICD-10-CM | POA: Diagnosis present

## 2013-11-19 DIAGNOSIS — Z87891 Personal history of nicotine dependence: Secondary | ICD-10-CM

## 2013-11-19 DIAGNOSIS — M25571 Pain in right ankle and joints of right foot: Secondary | ICD-10-CM

## 2013-11-19 DIAGNOSIS — Z8679 Personal history of other diseases of the circulatory system: Secondary | ICD-10-CM

## 2013-11-19 DIAGNOSIS — M25471 Effusion, right ankle: Secondary | ICD-10-CM

## 2013-11-19 DIAGNOSIS — M79609 Pain in unspecified limb: Secondary | ICD-10-CM | POA: Diagnosis present

## 2013-11-19 DIAGNOSIS — R234 Changes in skin texture: Secondary | ICD-10-CM | POA: Diagnosis present

## 2013-11-19 DIAGNOSIS — L97319 Non-pressure chronic ulcer of right ankle with unspecified severity: Secondary | ICD-10-CM | POA: Diagnosis present

## 2013-11-19 DIAGNOSIS — R52 Pain, unspecified: Secondary | ICD-10-CM | POA: Diagnosis present

## 2013-11-19 DIAGNOSIS — R609 Edema, unspecified: Secondary | ICD-10-CM | POA: Diagnosis present

## 2013-11-19 DIAGNOSIS — M79604 Pain in right leg: Secondary | ICD-10-CM

## 2013-11-19 DIAGNOSIS — Z8249 Family history of ischemic heart disease and other diseases of the circulatory system: Secondary | ICD-10-CM

## 2013-11-19 DIAGNOSIS — E876 Hypokalemia: Secondary | ICD-10-CM | POA: Diagnosis present

## 2013-11-19 DIAGNOSIS — R21 Rash and other nonspecific skin eruption: Secondary | ICD-10-CM | POA: Diagnosis present

## 2013-11-19 DIAGNOSIS — I517 Cardiomegaly: Secondary | ICD-10-CM | POA: Diagnosis present

## 2013-11-19 DIAGNOSIS — M25579 Pain in unspecified ankle and joints of unspecified foot: Secondary | ICD-10-CM | POA: Diagnosis present

## 2013-11-19 DIAGNOSIS — L97309 Non-pressure chronic ulcer of unspecified ankle with unspecified severity: Secondary | ICD-10-CM | POA: Diagnosis present

## 2013-11-19 DIAGNOSIS — B20 Human immunodeficiency virus [HIV] disease: Secondary | ICD-10-CM

## 2013-11-19 DIAGNOSIS — Z8673 Personal history of transient ischemic attack (TIA), and cerebral infarction without residual deficits: Secondary | ICD-10-CM

## 2013-11-19 DIAGNOSIS — M7989 Other specified soft tissue disorders: Secondary | ICD-10-CM | POA: Diagnosis present

## 2013-11-19 LAB — CBC WITH DIFFERENTIAL/PLATELET
BASOS ABS: 0.1 10*3/uL (ref 0.0–0.1)
BASOS PCT: 1 % (ref 0–1)
Basophils Absolute: 0.1 10*3/uL (ref 0.0–0.1)
Basophils Relative: 1 % (ref 0–1)
EOS ABS: 0.4 10*3/uL (ref 0.0–0.7)
EOS ABS: 0.4 10*3/uL (ref 0.0–0.7)
Eosinophils Relative: 5 % (ref 0–5)
Eosinophils Relative: 4 % (ref 0–5)
HCT: 22.1 % — ABNORMAL LOW (ref 39.0–52.0)
HCT: 21.9 % — ABNORMAL LOW (ref 39.0–52.0)
HEMOGLOBIN: 7.2 g/dL — AB (ref 13.0–17.0)
Hemoglobin: 7.3 g/dL — ABNORMAL LOW (ref 13.0–17.0)
LYMPHS ABS: 4.2 10*3/uL — AB (ref 0.7–4.0)
Lymphocytes Relative: 50 % — ABNORMAL HIGH (ref 12–46)
Lymphocytes Relative: 41 % (ref 12–46)
Lymphs Abs: 4.2 10*3/uL — ABNORMAL HIGH (ref 0.7–4.0)
MCH: 29.4 pg (ref 26.0–34.0)
MCH: 28.8 pg (ref 26.0–34.0)
MCHC: 33 g/dL (ref 30.0–36.0)
MCHC: 32.9 g/dL (ref 30.0–36.0)
MCV: 89.1 fL (ref 78.0–100.0)
MCV: 87.6 fL (ref 78.0–100.0)
MONOS PCT: 8 % (ref 3–12)
Monocytes Absolute: 0.9 10*3/uL (ref 0.1–1.0)
Monocytes Absolute: 0.9 10*3/uL (ref 0.1–1.0)
Monocytes Relative: 11 % (ref 3–12)
NEUTROS ABS: 4.7 10*3/uL (ref 1.7–7.7)
NEUTROS PCT: 33 % — AB (ref 43–77)
NEUTROS PCT: 46 % (ref 43–77)
Neutro Abs: 2.7 10*3/uL (ref 1.7–7.7)
PLATELETS: 362 10*3/uL (ref 150–400)
Platelets: 358 10*3/uL (ref 150–400)
RBC: 2.48 MIL/uL — AB (ref 4.22–5.81)
RBC: 2.5 MIL/uL — AB (ref 4.22–5.81)
RDW: 18.6 % — ABNORMAL HIGH (ref 11.5–15.5)
RDW: 19 % — ABNORMAL HIGH (ref 11.5–15.5)
WBC: 8.4 10*3/uL (ref 4.0–10.5)
WBC: 10.2 10*3/uL (ref 4.0–10.5)

## 2013-11-19 LAB — COMPREHENSIVE METABOLIC PANEL
ALBUMIN: 3.2 g/dL — AB (ref 3.5–5.2)
ALK PHOS: 135 U/L — AB (ref 39–117)
ALT: 53 U/L (ref 0–53)
ALT: 61 U/L — ABNORMAL HIGH (ref 0–53)
AST: 59 U/L — AB (ref 0–37)
AST: 49 U/L — ABNORMAL HIGH (ref 0–37)
Albumin: 3.3 g/dL — ABNORMAL LOW (ref 3.5–5.2)
Alkaline Phosphatase: 127 U/L — ABNORMAL HIGH (ref 39–117)
BUN: 7 mg/dL (ref 6–23)
BUN: 4 mg/dL — AB (ref 6–23)
CALCIUM: 8.7 mg/dL (ref 8.4–10.5)
CO2: 28 mEq/L (ref 19–32)
CO2: 27 meq/L (ref 19–32)
CREATININE: 0.46 mg/dL — AB (ref 0.50–1.35)
Calcium: 8.8 mg/dL (ref 8.4–10.5)
Chloride: 101 mEq/L (ref 96–112)
Chloride: 100 mEq/L (ref 96–112)
Creatinine, Ser: 0.5 mg/dL (ref 0.50–1.35)
GFR calc Af Amer: >90 mL/min (ref >90)
GFR calc non Af Amer: >90 mL/min (ref >90)
GLUCOSE: 89 mg/dL (ref 70–99)
Glucose, Bld: 93 mg/dL (ref 70–99)
POTASSIUM: 3.6 meq/L — AB (ref 3.7–5.3)
Potassium: 3.7 mEq/L (ref 3.7–5.3)
SODIUM: 137 meq/L (ref 137–147)
Sodium: 136 mEq/L — ABNORMAL LOW (ref 137–147)
TOTAL PROTEIN: 9.5 g/dL — AB (ref 6.0–8.3)
TOTAL PROTEIN: 8.8 g/dL — AB (ref 6.0–8.3)
Total Bilirubin: 2.1 mg/dL — ABNORMAL HIGH (ref 0.3–1.2)
Total Bilirubin: 2.6 mg/dL — ABNORMAL HIGH (ref 0.3–1.2)

## 2013-11-19 LAB — RETICULOCYTES
RBC.: 2.48 MIL/uL — ABNORMAL LOW (ref 4.22–5.81)
RBC.: 2.5 MIL/uL — AB (ref 4.22–5.81)
RETIC CT PCT: 12.8 % — AB (ref 0.4–3.1)
Retic Count, Absolute: 319.9 10*3/uL — ABNORMAL HIGH (ref 19.0–186.0)
Retic Count, Absolute: 320 10*3/uL — ABNORMAL HIGH (ref 19.0–186.0)
Retic Ct Pct: 12.9 % — ABNORMAL HIGH (ref 0.4–3.1)

## 2013-11-19 LAB — MAGNESIUM: MAGNESIUM: 1.7 mg/dL (ref 1.5–2.5)

## 2013-11-19 MED ORDER — SODIUM CHLORIDE 0.9 % IV SOLN
Freq: Once | INTRAVENOUS | Status: AC
  Administered 2013-11-19: 04:00:00 via INTRAVENOUS

## 2013-11-19 MED ORDER — HYDROCODONE-ACETAMINOPHEN 10-325 MG PO TABS: 1.0000 | ORAL_TABLET | Freq: Four times a day (QID) | ORAL | Status: DC | PRN

## 2013-11-19 MED ORDER — KETOROLAC TROMETHAMINE 30 MG/ML IJ SOLN
30.0000 mg | Freq: Four times a day (QID) | INTRAMUSCULAR | Status: DC
  Administered 2013-11-19 – 2013-11-22 (×12): 30 mg via INTRAVENOUS
  Filled 2013-11-19 (×4): qty 1
  Filled 2013-11-19: qty 2
  Filled 2013-11-19 (×7): qty 1
  Filled 2013-11-19: qty 2
  Filled 2013-11-19 (×4): qty 1

## 2013-11-19 MED ORDER — SODIUM CHLORIDE 0.9 % IV SOLN
INTRAVENOUS | Status: AC
  Administered 2013-11-20: 12:00:00 via INTRAVENOUS

## 2013-11-19 MED ORDER — SODIUM CHLORIDE 0.9 % IV SOLN
INTRAVENOUS | Status: AC
  Administered 2013-11-19 – 2013-11-20 (×2): via INTRAVENOUS

## 2013-11-19 MED ORDER — ACETAMINOPHEN 325 MG PO TABS
650.0000 mg | ORAL_TABLET | ORAL | Status: DC | PRN
  Administered 2013-11-20: 650 mg via ORAL
  Filled 2013-11-19: qty 2

## 2013-11-19 MED ORDER — DIPHENHYDRAMINE HCL 50 MG/ML IJ SOLN
12.5000 mg | INTRAMUSCULAR | Status: DC | PRN
Start: 2013-11-19 — End: 2013-11-20

## 2013-11-19 MED ORDER — DIPHENHYDRAMINE HCL 50 MG/ML IJ SOLN: 12.5000 mg | Freq: Four times a day (QID) | INTRAMUSCULAR | Status: DC | PRN

## 2013-11-19 MED ORDER — SODIUM CHLORIDE 0.9 % IJ SOLN: 9.0000 mL | INTRAMUSCULAR | Status: DC | PRN

## 2013-11-19 MED ORDER — PROMETHAZINE HCL 25 MG PO TABS
12.5000 mg | ORAL_TABLET | ORAL | Status: DC | PRN
  Administered 2013-11-19 – 2013-11-22 (×6): 25 mg via ORAL
  Filled 2013-11-19 (×6): qty 1

## 2013-11-19 MED ORDER — PROMETHAZINE HCL 25 MG RE SUPP
12.5000 mg | RECTAL | Status: DC | PRN
Start: 2013-11-19 — End: 2013-11-22

## 2013-11-19 MED ORDER — ONDANSETRON HCL 4 MG/2ML IJ SOLN: 4.0000 mg | Freq: Three times a day (TID) | INTRAMUSCULAR | Status: DC | PRN

## 2013-11-19 MED ORDER — DIPHENHYDRAMINE HCL 12.5 MG/5ML PO ELIX: 12.5000 mg | ORAL_SOLUTION | Freq: Four times a day (QID) | ORAL | Status: DC | PRN

## 2013-11-19 MED ORDER — SODIUM CHLORIDE 0.9 % IV SOLN
Freq: Once | INTRAVENOUS | Status: AC
  Administered 2013-11-19: 06:00:00 via INTRAVENOUS

## 2013-11-19 MED ORDER — HYDROMORPHONE 0.3 MG/ML IV SOLN
INTRAVENOUS | Status: DC
  Administered 2013-11-19: 4.7 mg via INTRAVENOUS
  Administered 2013-11-19 (×2): via INTRAVENOUS
  Administered 2013-11-20: 2.7 mg via INTRAVENOUS
  Administered 2013-11-20: 1.2 mg via INTRAVENOUS
  Administered 2013-11-20: 2.97 mg via INTRAVENOUS
  Administered 2013-11-20: 2.5 mg via INTRAVENOUS
  Filled 2013-11-19 (×2): qty 25

## 2013-11-19 MED ORDER — DIPHENHYDRAMINE HCL 50 MG/ML IJ SOLN
12.5000 mg | Freq: Once | INTRAMUSCULAR | Status: AC
  Administered 2013-11-19: 12.5 mg via INTRAVENOUS
  Filled 2013-11-19: qty 1

## 2013-11-19 MED ORDER — CLOBETASOL PROPIONATE 0.05 % EX OINT
1.0000 "application " | TOPICAL_OINTMENT | Freq: Two times a day (BID) | CUTANEOUS | Status: DC
  Administered 2013-11-19 – 2013-11-22 (×6): 1 via TOPICAL
  Filled 2013-11-19: qty 15

## 2013-11-19 MED ORDER — HYDROMORPHONE HCL PF 2 MG/ML IJ SOLN
2.0000 mg | INTRAMUSCULAR | Status: DC | PRN
  Administered 2013-11-19 (×8): 2 mg via INTRAVENOUS
  Filled 2013-11-19 (×8): qty 1

## 2013-11-19 MED ORDER — DIPHENHYDRAMINE HCL 25 MG PO CAPS: 25.0000 mg | ORAL_CAPSULE | ORAL | Status: DC | PRN

## 2013-11-19 MED ORDER — ONDANSETRON HCL 4 MG/2ML IJ SOLN
4.0000 mg | Freq: Once | INTRAMUSCULAR | Status: AC
  Administered 2013-11-19: 4 mg via INTRAVENOUS
  Filled 2013-11-19: qty 2

## 2013-11-19 MED ORDER — FOLIC ACID 1 MG PO TABS
1.0000 mg | ORAL_TABLET | Freq: Every morning | ORAL | Status: DC
  Administered 2013-11-20 – 2013-11-22 (×3): 1 mg via ORAL
  Filled 2013-11-19 (×3): qty 1

## 2013-11-19 MED ORDER — SODIUM CHLORIDE 0.9 % IV BOLUS (SEPSIS)
1000.0000 mL | Freq: Once | INTRAVENOUS | Status: AC
  Administered 2013-11-19: 1000 mL via INTRAVENOUS

## 2013-11-19 MED ORDER — ONDANSETRON HCL 4 MG/2ML IJ SOLN
4.0000 mg | Freq: Four times a day (QID) | INTRAMUSCULAR | Status: DC | PRN
  Administered 2013-11-19 (×2): 4 mg via INTRAVENOUS
  Filled 2013-11-19 (×2): qty 2

## 2013-11-19 MED ORDER — ELVITEG-COBIC-EMTRICIT-TENOFDF 150-150-200-300 MG PO TABS
1.0000 | ORAL_TABLET | Freq: Every day | ORAL | Status: DC
  Administered 2013-11-19 – 2013-11-22 (×4): 1 via ORAL
  Filled 2013-11-19 (×5): qty 1

## 2013-11-19 MED ORDER — ACETAMINOPHEN 650 MG RE SUPP: 650.0000 mg | RECTAL | Status: DC | PRN

## 2013-11-19 MED ORDER — VITAMIN C 500 MG PO TABS: 1000.0000 mg | ORAL_TABLET | ORAL | Status: DC

## 2013-11-19 MED ORDER — VITAMIN C 500 MG PO TABS
1000.0000 mg | ORAL_TABLET | ORAL | Status: DC
  Administered 2013-11-20 – 2013-11-22 (×2): 1000 mg via ORAL
  Filled 2013-11-19 (×2): qty 2

## 2013-11-19 MED ORDER — FOLIC ACID 1 MG PO TABS: 1.0000 mg | ORAL_TABLET | Freq: Every day | ORAL | Status: DC

## 2013-11-19 MED ORDER — HYDROMORPHONE HCL PF 1 MG/ML IJ SOLN: 1.0000 mg | INTRAMUSCULAR | Status: DC

## 2013-11-19 MED ORDER — NALOXONE HCL 0.4 MG/ML IJ SOLN: 0.4000 mg | INTRAMUSCULAR | Status: DC | PRN

## 2013-11-19 MED ORDER — SENNA 8.6 MG PO TABS
2.0000 | ORAL_TABLET | Freq: Two times a day (BID) | ORAL | Status: DC
  Administered 2013-11-19 – 2013-11-20 (×2): 17.2 mg via ORAL
  Filled 2013-11-19 (×5): qty 2

## 2013-11-19 MED ORDER — CETAPHIL GENTLE CLEANSER EX LOTN
1.0000 "application " | TOPICAL_LOTION | Freq: Every day | CUTANEOUS | Status: DC
  Administered 2013-11-19 – 2013-11-22 (×4): 1 via TOPICAL
  Filled 2013-11-19: qty 237

## 2013-11-19 NOTE — ED Notes (Signed)
Vital signs stable. 

## 2013-11-19 NOTE — H&P (Addendum)
[facility] History and Physical  English Craighead UKG:[id number] DOB: [birth year] DOA: 11/19/2013  Referring physician: ER physician PCP: MATTHEWS,MICHELLE A., MD   Chief Complaint: sickle cell pain, right leg pain  HPI:  28 y.o. male with a PMH of sickle cell/hemoglobin SS disease, HIV (in 07/2013 CD4 was 750) on HAART, recent past admissions for sickle cell pain crisis who presented to [facility] ED 11/19/2013 with ongoing right ankle pain, swelling and scaly rash. He was apparently discharged on antibiotic on previous admission but per EPIC review this was for sinusitis and there was no real concern for cellulitis. He additionally reports having generalized body aches typical of his sickle cell pain crisis. Pain was not relieved with home analgesia. No ever or chest pain, no cough, no shortness of breath, palpitations. No abdominal pain, nausea or vomiting. No blood in stool or urine.  Assessment/Plan:   Principal Problem:  Acute sickle cell pain crisis  - Pain management: Norco by mouth every 6 hours PRN, Dilaudid PCA; also ketorolac 30 mg IV Q 6 hours - adjuvant therapy: none - continue folic acid   Active Problems:   Hypokalemia  - repeat potassium WNL  HIV (human immunodeficiency virus infection)  - on HAART; CD4 in 07/2013 750 Chronic Anemia  - Patient's baseline hemoglobin is: 7.5-8.5.  - Patient's transfusion hemoglobin threshold is: Less than 7 mg/dL.  - hemoglobin 7.2 on this admission Right leg pain - pain and swelling - rule out DVT, order placed for LE doppler   Medical Consultants:  None. Other Consultants:  None. Anti-infectives:  None.   Radiological Exams on Admission: Dg Chest 2 View 11/19/2013   IMPRESSION: Stable cardiomegaly with mild diffuse pulmonary vascular congestion without overt pulmonary edema. No focal infiltrates identified.     Dg Ankle Complete Right 11/19/2013    IMPRESSION: Soft tissue swelling around the ankle.  No acute osseous findings.       Code Status: Full Family Communication: Pt at bedside Disposition Plan: Admit for further evaluation  Manson Passey, MD  [facility] Pager [phone]  Review of Systems:  Constitutional: Negative for fever, chills and malaise/fatigue. Negative for diaphoresis.  HENT: Negative for hearing loss, ear pain, nosebleeds, congestion, sore throat, neck pain, tinnitus and ear discharge.   Eyes: Negative for blurred vision, double vision, photophobia, pain, discharge and redness.  Respiratory: Negative for cough, hemoptysis, sputum production, shortness of breath, wheezing and stridor.   Cardiovascular: Negative for chest pain, palpitations, orthopnea, claudication and leg swelling.  Gastrointestinal: Negative for nausea, vomiting and abdominal pain. Negative for heartburn, constipation, blood in stool and melena.  Genitourinary: Negative for dysuria, urgency, frequency, hematuria and flank pain.  Musculoskeletal: per HPI Skin: Negative for itching and positive for rash on right LE Neurological: Negative for dizziness and weakness. Negative for tingling, tremors, sensory change, speech change, focal weakness, loss of consciousness and headaches.  Endo/Heme/Allergies: Negative for environmental allergies and polydipsia. Does not bruise/bleed easily.  Psychiatric/Behavioral: Negative for suicidal ideas. The patient is not nervous/anxious.      Past Medical History  Diagnosis Date  . Sickle cell anemia   . Immune deficiency disorder   . HIV (human immunodeficiency virus infection)   . Shingles   . Stroke     1998   Past Surgical History  Procedure Laterality Date  . Brain surgery    . Cholecystectomy     Social History:  reports that he has quit smoking. His smoking use included Cigarettes. He started smoking about 17 months  ago. He smoked 0.00 packs per day. He has never used smokeless tobacco. He reports that he drinks alcohol. He reports that he does not use illicit  drugs.  Allergies  Allergen Reactions  . Banana Anaphylaxis  . Other Anaphylaxis and Rash    melons  . Heparin Hives  . Lovenox [Enoxaparin Sodium] Hives  . Morphine And Related Hives and Itching    Doesn't work for pt  . Adhesive [Tape] Rash    Family History:  Family History  Problem Relation Age of Onset  . Heart disease Father   . Diabetes Father   . Hypertension Maternal Aunt   . Diabetes Maternal Aunt   . Cancer Maternal Grandmother   . Heart disease Mother   . Diabetes Mother      Prior to Admission medications   Medication Sig Start Date End Date Taking? Authorizing Provider  Ascorbic Acid (VITAMIN C) 1000 MG tablet Take 1,000 mg by mouth every other day.   Yes Historical Provider, MD  cetaphil (CETAPHIL) lotion Apply 1 application topically 2 (two) times daily.   Yes Historical Provider, MD  clobetasol ointment (TEMOVATE) 0.05 % Apply 1 application topically 2 (two) times daily.   Yes Historical Provider, MD  diphenhydrAMINE (BENADRYL) 25 mg capsule Take 25 mg by mouth every 6 (six) hours as needed.   Yes Historical Provider, MD  elvitegravir-cobicistat-emtricitabine-tenofovir (STRIBILD) 150-150-200-300 MG TABS tablet Take 1 tablet by mouth daily. 09/12/13  Yes Gardiner Barefoot, MD  folic acid (FOLVITE) 1 MG tablet Take 1 mg by mouth every morning.    Yes Historical Provider, MD  HYDROcodone-acetaminophen (LORTAB) 10-500 MG per tablet Take 1 tablet by mouth every 6 (six) hours as needed for pain. 09/01/13  Yes Tiffany Irine Seal, PA-C  paraben-cetyl & stearyl alcohol (CETAPHIL CLEANSER) external solution Apply 1 application topically daily.   Yes Historical Provider, MD  promethazine (PHENERGAN) 25 MG tablet Take 25 mg by mouth every 6 (six) hours as needed for nausea. 05/18/13  Yes Alison Murray, MD  senna (SENOKOT) 8.6 MG TABS tablet Take 2 tablets by mouth 2 (two) times daily.   Yes Historical Provider, MD  amoxicillin-clavulanate (AUGMENTIN) 875-125 MG per tablet Take 1  tablet by mouth 2 (two) times daily. For 14 days    Historical Provider, MD   Physical Exam: Filed Vitals:   11/19/13 0132 11/19/13 0627  BP: 129/82 124/71  Pulse: 79 77  Temp: 98.4 F (36.9 C) 97.8 F (36.6 C)  TempSrc: Oral Oral  Resp: 18 16  Height: 5\' 5"  (1.651 m)   Weight: 60.328 kg (133 lb)   SpO2: 96% 95%    Physical Exam  Constitutional: Appears well-developed and well-nourished. No distress.  HENT: Normocephalic. External right and left ear normal.  Eyes: Conjunctivae and EOM are normal.   Neck: Normal ROM. Neck supple. No JVD. No tracheal deviation. CVS: RRR, S1/S2 +, no murmurs, no gallops, no carotid bruit.  Pulmonary: Effort and breath sounds normal, no stridor, rhonchi, wheezes, rales.  Abdominal: Soft. BS +,  no distension, tenderness, rebound or guarding.  Musculoskeletal: Normal range of motion. Right ankle swelling, rash noted in this area  Lymphadenopathy: No lymphadenopathy noted, cervical, inguinal. Neuro: Alert. No focal neurologic deficits Skin: Skin is warm and dry.  Psychiatric: Normal mood and affect.   Labs on Admission:  Basic Metabolic Panel:  Recent Labs Lab 11/12/13 1737 11/14/13 0355 11/15/13 0421 11/19/13 0225  NA 137 136* 137 137  K 4.1 3.8 3.7 3.6*  CL 101 100 100 101  CO2 26 27 29 28   GLUCOSE 99 82 88 93  BUN 5* 5* 5* 7  CREATININE 0.51 0.43* 0.42* 0.50  CALCIUM 8.8 8.9 8.9 8.8   Liver Function Tests:  Recent Labs Lab 11/12/13 1737 11/14/13 0355 11/15/13 0421 11/19/13 0225  AST 91* 66* 60* 59*  ALT 71* 68* 61* 53  ALKPHOS 102 131* 138* 135*  BILITOT 2.8* 4.1* 3.8* 2.1*  PROT 10.1* 9.0* 8.6* 9.5*  ALBUMIN 3.5 3.0* 3.0* 3.2*   No results found for this basename: LIPASE, AMYLASE,  in the last 168 hours No results found for this basename: AMMONIA,  in the last 168 hours CBC:  Recent Labs Lab 11/12/13 1737 11/13/13 0411 11/14/13 0355 11/15/13 0421 11/19/13 0225  WBC 8.2 9.4 11.0* 6.8 8.4  NEUTROABS 3.7 4.9 6.8  3.4 2.7  HGB 7.8* 6.9* 7.1* 7.6* 7.3*  HCT 23.3* 20.6* 21.6* 22.8* 22.1*  MCV 86.9 86.6 87.8 87.4 89.1  PLT 348 336 322 339 362   Cardiac Enzymes: No results found for this basename: CKTOTAL, CKMB, CKMBINDEX, TROPONINI,  in the last 168 hours BNP: No components found with this basename: POCBNP,  CBG: No results found for this basename: GLUCAP,  in the last 168 hours  If 7PM-7AM, please contact night-coverage [email or web address] Password [email or web address] 11/19/2013, 7:23 AM

## 2013-11-19 NOTE — ED Notes (Addendum)
Pt was placed into bed on 5W, not tele. Pt was ordered Tele bed. Called Mary bed placement to have bed reassigned.

## 2013-11-19 NOTE — ED Provider Notes (Signed)
Medical screening examination/treatment/procedure(s) were conducted as a shared visit with non-physician practitioner(s) and myself.  I personally evaluated the patient during the encounter.  Patient with sickle cell crisis, has healing vesicular rash over the right lower leg. Peristent pain despite multiple attempts at pain control. Will admit for pain management of sickle cell crisis.   Brandt LoosenJulie Manly, MD 11/19/13 [phone]

## 2013-11-19 NOTE — ED Notes (Signed)
Pt c/o SS pain to R ankle onset 0500. +nausea, HA, SHOB.

## 2013-11-19 NOTE — ED Notes (Signed)
Patient is resting comfortably. 

## 2013-11-19 NOTE — ED Provider Notes (Signed)
CSN: [id number]     Arrival date & time 11/19/13  0112 History   First MD Initiated Contact with Patient 11/19/13 [phone]     Chief Complaint  Patient presents with  . Sickle Cell Pain Crisis     (Consider location/radiation/quality/duration/timing/severity/associated sxs/prior Treatment) HPI Jeremy Mclaughlin is a 28 y.o. male with history of sickle cell disease and HIV, who presents to emergency department complaining of right leg pain. Patient states he has sickle cell disease, and states she's having body aches due to his disease. Patient with recent hospitalization with similar symptoms. States that he never got better. He's taking hydrocodone at home with no relief. He was discharged on antibiotics however he states he never received it. He was diagnosed with right leg cellulitis. Patient denies any fever or chills. He does admit to diffuse body aches, headache, nausea home. He denies any calf pain or pain above his ankle. He denies any recent travel or surgeries. He did have venous Doppler done a week ago which was negative. He also admits to a rash to the right lower leg, states unsure what he was diagnosed with. He denies any upper respiratory type symptoms, denies any cough or congestion. He denies any nausea, vomiting, diarrhea.  Past Medical History  Diagnosis Date  . Sickle cell anemia   . Immune deficiency disorder   . HIV (human immunodeficiency virus infection)   . Shingles   . Stroke     1998   Past Surgical History  Procedure Laterality Date  . Brain surgery    . Cholecystectomy     Family History  Problem Relation Age of Onset  . Heart disease Father   . Diabetes Father   . Hypertension Maternal Aunt   . Diabetes Maternal Aunt   . Cancer Maternal Grandmother   . Heart disease Mother   . Diabetes Mother    History  Substance Use Topics  . Smoking status: Former Smoker    Types: Cigarettes    Start date: 06/15/2012  . Smokeless tobacco: Never Used  . Alcohol Use:  Yes     Comment: socially    Review of Systems  Constitutional: Negative for fever and chills.  Respiratory: Negative for cough, chest tightness and shortness of breath.   Cardiovascular: Negative for chest pain, palpitations and leg swelling.  Gastrointestinal: Negative for nausea, vomiting, abdominal pain, diarrhea and abdominal distention.  Genitourinary: Negative for dysuria, urgency, frequency and hematuria.  Musculoskeletal: Positive for arthralgias, joint swelling and myalgias. Negative for neck pain and neck stiffness.  Skin: Positive for rash and wound.  Allergic/Immunologic: Negative for immunocompromised state.  Neurological: Negative for dizziness, weakness, light-headedness, numbness and headaches.      Allergies  Banana; Other; Heparin; Lovenox; Morphine and related; and Adhesive  Home Medications   Current Outpatient Rx  Name  Route  Sig  Dispense  Refill  . Ascorbic Acid (VITAMIN C) 1000 MG tablet   Oral   Take 1,000 mg by mouth every other day.         . cetaphil (CETAPHIL) lotion   Topical   Apply 1 application topically 2 (two) times daily.         . clobetasol ointment (TEMOVATE) 0.05 %   Topical   Apply 1 application topically 2 (two) times daily.         . diphenhydrAMINE (BENADRYL) 25 mg capsule   Oral   Take 25 mg by mouth every 6 (six) hours as needed.         .Marland Kitchen  elvitegravir-cobicistat-emtricitabine-tenofovir (STRIBILD) 150-150-200-300 MG TABS tablet   Oral   Take 1 tablet by mouth daily.   30 tablet   5   . folic acid (FOLVITE) 1 MG tablet   Oral   Take 1 mg by mouth every morning.          Marland Kitchen HYDROcodone-acetaminophen (LORTAB) 10-500 MG per tablet   Oral   Take 1 tablet by mouth every 6 (six) hours as needed for pain.   20 tablet   0   . paraben-cetyl & stearyl alcohol (CETAPHIL CLEANSER) external solution   Topical   Apply 1 application topically daily.         . promethazine (PHENERGAN) 25 MG tablet   Oral   Take  25 mg by mouth every 6 (six) hours as needed for nausea.         Marland Kitchen senna (SENOKOT) 8.6 MG TABS tablet   Oral   Take 2 tablets by mouth 2 (two) times daily.         Marland Kitchen amoxicillin-clavulanate (AUGMENTIN) 875-125 MG per tablet   Oral   Take 1 tablet by mouth 2 (two) times daily. For 14 days          BP 129/82  Pulse 79  Temp(Src) 98.4 F (36.9 C) (Oral)  Resp 18  Ht 5\' 5"  (1.651 m)  Wt 133 lb (60.328 kg)  BMI 22.13 kg/m2  SpO2 96% Physical Exam  Nursing note and vitals reviewed. Constitutional: He is oriented to person, place, and time. He appears well-developed and well-nourished. No distress.  HENT:  Head: Normocephalic and atraumatic.  Eyes: Scleral icterus is present.  Neck: Neck supple.  Cardiovascular: Normal rate, regular rhythm and normal heart sounds.   Pulmonary/Chest: Effort normal. No respiratory distress. He has no wheezes. He has no rales.  Abdominal: Soft. Bowel sounds are normal. He exhibits no distension. There is no tenderness. There is no rebound.  Musculoskeletal:  Swelling noted to the right ankle joint. Tender to palpation over medial and lateral malleoli. Ankle appears to be warm to the touch. Normal foot and knee. There is a scabbing, scaly, patchy rash to the dorsal shin just above the ankle and more proximal near the knee. Rash is nontender. No drainage.  Neurological: He is alert and oriented to person, place, and time.  Skin: Skin is warm and dry.    ED Course  Procedures (including critical care time) Labs Review Labs Reviewed  CBC WITH DIFFERENTIAL - Abnormal; Notable for the following:    RBC 2.48 (*)    Hemoglobin 7.3 (*)    HCT 22.1 (*)    RDW 18.6 (*)    Neutrophils Relative % 33 (*)    Lymphocytes Relative 50 (*)    Lymphs Abs 4.2 (*)    All other components within normal limits  COMPREHENSIVE METABOLIC PANEL - Abnormal; Notable for the following:    Potassium 3.6 (*)    Total Protein 9.5 (*)    Albumin 3.2 (*)    AST 59 (*)     Alkaline Phosphatase 135 (*)    Total Bilirubin 2.1 (*)    All other components within normal limits  RETICULOCYTES - Abnormal; Notable for the following:    Retic Ct Pct 12.9 (*)    RBC. 2.48 (*)    Retic Count, Manual 319.9 (*)    All other components within normal limits   Imaging Review Dg Ankle Complete Right  11/19/2013   CLINICAL DATA:  Sickle cell  pain crisis. Pain and swelling. No injury.  EXAM: RIGHT ANKLE - COMPLETE 3+ VIEW  COMPARISON:  None.  FINDINGS: There is soft tissue swelling about the ankle, worse laterally where there is also mild irregularity of the skin. No acute osseous abnormality such as fracture, visible bone infection, or visible bone infarct.  IMPRESSION: Soft tissue swelling around the ankle.  No acute osseous findings.   Electronically Signed   By: Tiburcio Pea M.D.   On: 11/19/2013 04:03     EKG Interpretation None      MDM   Final diagnoses:  Sickle cell crisis  Right leg pain   Patient states he is having a sickle cell crisis, also having right leg pain and swelling. Recently diagnosed with cellulitis. On exam, he does have swelling in the right ankle, with a rash over anterior shin. This rash is suspicious for shingles rash. Will get labs, fluids and pain medications started.    6:27 AM Pain still 10/10 after multiple doses of IV 2mg  Dilaudid which were ordered apart. Will admit. Spoke with [facility]. Not convinced right ankle swelling is infectious, due to no fever, no elevation in WBC. Pt denies cough or cp, VS normal, doubt acute chest.   Pt will be admitted to [facility].   Filed Vitals:   11/19/13 0132  BP: 129/82  Pulse: 79  Temp: 98.4 F (36.9 C)  TempSrc: Oral  Resp: 18  Height: 5\' 5"  (1.651 m)  Weight: 133 lb (60.328 kg)  SpO2: 96%       Lottie Mussel, PA-C 11/19/13 [phone]

## 2013-11-19 NOTE — ED Notes (Signed)
Pt sleeping quietly at this time. Even, unlabored resp. Will monitor

## 2013-11-20 DIAGNOSIS — M25579 Pain in unspecified ankle and joints of unspecified foot: Secondary | ICD-10-CM

## 2013-11-20 DIAGNOSIS — L97319 Non-pressure chronic ulcer of right ankle with unspecified severity: Secondary | ICD-10-CM | POA: Diagnosis present

## 2013-11-20 DIAGNOSIS — M79609 Pain in unspecified limb: Secondary | ICD-10-CM

## 2013-11-20 DIAGNOSIS — Z8679 Personal history of other diseases of the circulatory system: Secondary | ICD-10-CM

## 2013-11-20 DIAGNOSIS — M25571 Pain in right ankle and joints of right foot: Secondary | ICD-10-CM | POA: Diagnosis present

## 2013-11-20 DIAGNOSIS — L97309 Non-pressure chronic ulcer of unspecified ankle with unspecified severity: Secondary | ICD-10-CM

## 2013-11-20 MED ORDER — HYDROCODONE-ACETAMINOPHEN 10-325 MG PO TABS
1.0000 | ORAL_TABLET | Freq: Four times a day (QID) | ORAL | Status: DC
  Administered 2013-11-20 – 2013-11-21 (×4): 1 via ORAL
  Filled 2013-11-20 (×4): qty 1

## 2013-11-20 MED ORDER — DIPHENHYDRAMINE HCL 50 MG/ML IJ SOLN
12.5000 mg | Freq: Four times a day (QID) | INTRAMUSCULAR | Status: DC | PRN
  Administered 2013-11-20: 12.5 mg via INTRAVENOUS
  Filled 2013-11-20: qty 1

## 2013-11-20 MED ORDER — SODIUM CHLORIDE 0.9 % IJ SOLN: 9.0000 mL | INTRAMUSCULAR | Status: DC | PRN

## 2013-11-20 MED ORDER — NALOXONE HCL 0.4 MG/ML IJ SOLN: 0.4000 mg | INTRAMUSCULAR | Status: DC | PRN

## 2013-11-20 MED ORDER — HYDROMORPHONE 0.3 MG/ML IV SOLN
INTRAVENOUS | Status: DC
  Administered 2013-11-20: 2.7 mg via INTRAVENOUS
  Administered 2013-11-20: 4.2 mg via INTRAVENOUS
  Administered 2013-11-20: 20:00:00 via INTRAVENOUS
  Administered 2013-11-20: 2.7 mg via INTRAVENOUS
  Administered 2013-11-20: 12:00:00 via INTRAVENOUS
  Administered 2013-11-21: 1.2 mg via INTRAVENOUS
  Administered 2013-11-21: 0.6 mg via INTRAVENOUS
  Administered 2013-11-21: 1.2 mg via INTRAVENOUS
  Administered 2013-11-21: 1.5 mg via INTRAVENOUS
  Administered 2013-11-21: 13:00:00 via INTRAVENOUS
  Administered 2013-11-21: 3.3 mg via INTRAVENOUS
  Administered 2013-11-22: 11:00:00 via INTRAVENOUS
  Administered 2013-11-22: 0.19 mg via INTRAVENOUS
  Administered 2013-11-22: 1.19 mg via INTRAVENOUS
  Administered 2013-11-22: 0.7 mg via INTRAVENOUS
  Filled 2013-11-20 (×5): qty 25

## 2013-11-20 MED ORDER — ONDANSETRON HCL 4 MG/2ML IJ SOLN
4.0000 mg | Freq: Four times a day (QID) | INTRAMUSCULAR | Status: DC | PRN
  Administered 2013-11-20 – 2013-11-21 (×3): 4 mg via INTRAVENOUS
  Filled 2013-11-20 (×4): qty 2

## 2013-11-20 MED ORDER — DIPHENHYDRAMINE HCL 12.5 MG/5ML PO ELIX: 12.5000 mg | ORAL_SOLUTION | Freq: Four times a day (QID) | ORAL | Status: DC | PRN

## 2013-11-20 MED ORDER — ASPIRIN 81 MG PO CHEW
81.0000 mg | CHEWABLE_TABLET | Freq: Every day | ORAL | Status: DC
  Administered 2013-11-20 – 2013-11-22 (×3): 81 mg via ORAL
  Filled 2013-11-20 (×3): qty 1

## 2013-11-20 NOTE — Progress Notes (Signed)
VASCULAR LAB PRELIMINARY  PRELIMINARY  PRELIMINARY  PRELIMINARY  Right lower extremity venous Doppler completed.    Preliminary report:  There is no DVT or SVT noted in the right lower extremity.  There are several enlarged lymph nodes noted in the right groin.  Winifred Bodiford, RVT 11/20/2013, 3:25 PM

## 2013-11-20 NOTE — Progress Notes (Signed)
PHYSICIAN PROGRESS NOTE  Apolinar JunesBrandon Lythgoe ZOX:[id number] DOB: 02/10/1986 DOA: 11/19/2013  11/20/2013   PCP: MATTHEWS,MICHELLE A., MD  Assessment/Plan: 1. Acute HbSS vaso-occlusive pain crisis - Pt states that pain is improving in knees on dilaudid PCA.  He has used 12.47 mg, 40 demands, 40 deliveries in last 24 hours.  With this improvement will start transitioning him over to an oral regimen by scheduling his home oral meds, will leave PCA in background at reduced dose for breakthrough pain as needed.  Continue torodol for inflammation.  Continue supportive therapy.  Encouraged Incentive spirometry and ambulation.  I reviewed records from Pt's hematologist in [facility] from [facility] CoinsJohn Owen, [place] and patient has been receiving monthly exchange transfusions with them.   2. Hypokalemia - repleted, will follow CMP 3. Chronic Anemia - hemoglobin holding at 7, will follow.  Asymptomatic at this time.  4. HIV - Continue HAART therapy (Stribild), CD4 750 Nov 2014. Follow up with ID clinic 5. RLE swelling and pain - LE doppler pending to rule out DVT.  No acute osseous findings on imaging. Will get ortho consult for eval if no improvement and not able to ambulate with PT assistance. Ordered PT eval today.  6. History of MoyaMoya syndrome - Pt had been on aspirin for stroke prophylaxis, he reports that he was recently taken off this temporarily by his ID docs as he was starting a new HIV medication (stribild) which he has been tolerating well.  Will go ahead and restart.    7. Allergy to Heparins/Lovenox - SCDs ordered. PT eval ordered.   Code Status: FULL  HPI/Subjective: Pt reports that his sickle crisis pain is improving (knees) but he is having pain in right ankle and swelling.   Review of Systems - Negative except pain in knees, right ankle, rash right foot and ankle, swollen right foot  Objective: Filed Vitals:   11/20/13 0823  BP:   Pulse:   Temp:   Resp: 11    Intake/Output Summary (Last 24  hours) at 11/20/13 0938 Last data filed at 11/20/13 0700  Gross per 24 hour  Intake   1500 ml  Output   4726 ml  Net  -3226 ml   Filed Weights   11/19/13 0132  Weight: 133 lb (60.328 kg)   Exam: Constitutional: Appears well-developed and well-nourished. No distress.  HENT: Normocephalic. External right and left ear normal.  Eyes: bilateral scleral icterus. EOM are normal.  Neck: Normal ROM. Neck supple. No JVD. No tracheal deviation.  CVS: RRR, S1/S2 +, no murmurs, no gallops, no carotid bruit.  Pulmonary: Effort and breath sounds normal, no stridor, rhonchi, wheezes, rales.  Abdominal: Soft. BS +, no distension, tenderness, rebound or guarding.  Musculoskeletal: Normal range of motion. Mild right ankle and foot swelling, scaly rash right leg foot, small ulcer right ankle  Neuro: Alert. No focal neurologic deficits  Skin: rash noted above and ulcer noted above Psychiatric: Flat affect noted.   Data Reviewed: Basic Metabolic Panel:  Recent Labs Lab 11/14/13 0355 11/15/13 0421 11/19/13 0225 11/19/13 1721  NA 136* 137 137 136*  K 3.8 3.7 3.6* 3.7  CL 100 100 101 100  CO2 27 29 28 27   GLUCOSE 82 88 93 89  BUN 5* 5* 7 4*  CREATININE 0.43* 0.42* 0.50 0.46*  CALCIUM 8.9 8.9 8.8 8.7  MG  --   --   --  1.7   Liver Function Tests:  Recent Labs Lab 11/14/13 0355 11/15/13 0421 11/19/13 0225 11/19/13  1721  AST 66* 60* 59* 49*  ALT 68* 61* 53 61*  ALKPHOS 131* 138* 135* 127*  BILITOT 4.1* 3.8* 2.1* 2.6*  PROT 9.0* 8.6* 9.5* 8.8*  ALBUMIN 3.0* 3.0* 3.2* 3.3*   No results found for this basename: LIPASE, AMYLASE,  in the last 168 hours No results found for this basename: AMMONIA,  in the last 168 hours CBC:  Recent Labs Lab 11/14/13 0355 11/15/13 0421 11/19/13 0225 11/19/13 1721  WBC 11.0* 6.8 8.4 10.2  NEUTROABS 6.8 3.4 2.7 4.7  HGB 7.1* 7.6* 7.3* 7.2*  HCT 21.6* 22.8* 22.1* 21.9*  MCV 87.8 87.4 89.1 87.6  PLT 322 339 362 358   Cardiac Enzymes: No results  found for this basename: CKTOTAL, CKMB, CKMBINDEX, TROPONINI,  in the last 168 hours BNP (last 3 results) No results found for this basename: PROBNP,  in the last 8760 hours CBG: No results found for this basename: GLUCAP,  in the last 168 hours  No results found for this or any previous visit (from the past 240 hour(s)).   Studies: Dg Chest 2 View  11/19/2013   CLINICAL DATA:  Sickle cell pain crisis  EXAM: CHEST  2 VIEW  COMPARISON:  Prior radiograph from 11/13/2013  FINDINGS: Cardiomegaly is stable as compared to the prior exam.  The lungs are normally inflated. There is mild diffuse pulmonary vascular congestion without overt pulmonary edema. No focal infiltrate identified. No pleural effusion. No pneumothorax.  No acute osseous abnormality identified.  IMPRESSION: Stable cardiomegaly with mild diffuse pulmonary vascular congestion without overt pulmonary edema. No focal infiltrates identified.   Electronically Signed   By: Rise Mu M.D.   On: 11/19/2013 06:49   Dg Ankle Complete Right  11/19/2013   CLINICAL DATA:  Sickle cell pain crisis. Pain and swelling. No injury.  EXAM: RIGHT ANKLE - COMPLETE 3+ VIEW  COMPARISON:  None.  FINDINGS: There is soft tissue swelling about the ankle, worse laterally where there is also mild irregularity of the skin. No acute osseous abnormality such as fracture, visible bone infection, or visible bone infarct.  IMPRESSION: Soft tissue swelling around the ankle.  No acute osseous findings.   Electronically Signed   By: Tiburcio Pea M.D.   On: 11/19/2013 04:03   Scheduled Meds: . clobetasol ointment  1 application Topical BID  . elvitegravir-cobicistat-emtricitabine-tenofovir  1 tablet Oral Q breakfast  . folic acid  1 mg Oral q morning - 10a  . HYDROmorphone PCA 0.3 mg/mL   Intravenous 6 times per day  . ketorolac  30 mg Intravenous 4 times per day  . paraben-cetyl & stearyl alcohol  1 application Topical Daily  . senna  2 tablet Oral BID  .  vitamin C  1,000 mg Oral QODAY   Continuous Infusions: . sodium chloride 75 mL/hr at 11/20/13 0745   Active Problems:   Sickle cell pain crisis   Human immunodeficiency virus (HIV) disease   Anemia   Hypokalemia  Samra Pesch [employer] [phone]. If 7PM-7AM, please contact night-coverage at [email or web address], password [facility] 11/20/2013, 9:38 AM  LOS: 1 day

## 2013-11-21 ENCOUNTER — Other Ambulatory Visit: Payer: Self-pay | Admitting: *Deleted

## 2013-11-21 ENCOUNTER — Other Ambulatory Visit: Payer: Medicaid Other

## 2013-11-21 DIAGNOSIS — B20 Human immunodeficiency virus [HIV] disease: Secondary | ICD-10-CM

## 2013-11-21 DIAGNOSIS — M79609 Pain in unspecified limb: Secondary | ICD-10-CM

## 2013-11-21 LAB — CBC
HCT: 23.5 % — ABNORMAL LOW (ref 39.0–52.0)
Hemoglobin: 7.7 g/dL — ABNORMAL LOW (ref 13.0–17.0)
MCH: 28.7 pg (ref 26.0–34.0)
MCHC: 32.8 g/dL (ref 30.0–36.0)
MCV: 87.7 fL (ref 78.0–100.0)
PLATELETS: 430 10*3/uL — AB (ref 150–400)
RBC: 2.68 MIL/uL — AB (ref 4.22–5.81)
RDW: 19.7 % — AB (ref 11.5–15.5)
WBC: 7.8 10*3/uL (ref 4.0–10.5)

## 2013-11-21 LAB — COMPREHENSIVE METABOLIC PANEL
ALT: 54 U/L — ABNORMAL HIGH (ref 0–53)
AST: 56 U/L — AB (ref 0–37)
Albumin: 3.4 g/dL — ABNORMAL LOW (ref 3.5–5.2)
Alkaline Phosphatase: 139 U/L — ABNORMAL HIGH (ref 39–117)
BUN: 5 mg/dL — ABNORMAL LOW (ref 6–23)
CALCIUM: 9 mg/dL (ref 8.4–10.5)
CO2: 32 meq/L (ref 19–32)
CREATININE: 0.56 mg/dL (ref 0.50–1.35)
Chloride: 96 mEq/L (ref 96–112)
GFR calc Af Amer: >90 mL/min (ref >90)
GFR calc non Af Amer: >90 mL/min (ref >90)
Glucose, Bld: 88 mg/dL (ref 70–99)
Potassium: 3.5 mEq/L — ABNORMAL LOW (ref 3.7–5.3)
Sodium: 137 mEq/L (ref 137–147)
TOTAL PROTEIN: 9.4 g/dL — AB (ref 6.0–8.3)
Total Bilirubin: 3.4 mg/dL — ABNORMAL HIGH (ref 0.3–1.2)

## 2013-11-21 MED ORDER — MUPIROCIN CALCIUM 2 % EX CREA
TOPICAL_CREAM | Freq: Every day | CUTANEOUS | Status: DC
  Administered 2013-11-21 – 2013-11-22 (×2): via TOPICAL
  Filled 2013-11-21: qty 15

## 2013-11-21 MED ORDER — POTASSIUM CHLORIDE CRYS ER 20 MEQ PO TBCR
40.0000 meq | EXTENDED_RELEASE_TABLET | Freq: Once | ORAL | Status: AC
  Administered 2013-11-21: 40 meq via ORAL
  Filled 2013-11-21: qty 2

## 2013-11-21 MED ORDER — HYDROCODONE-ACETAMINOPHEN 10-325 MG PO TABS: 1.0000 | ORAL_TABLET | Freq: Four times a day (QID) | ORAL | Status: DC | PRN

## 2013-11-21 NOTE — Evaluation (Signed)
Physical Therapy Evaluation Patient Details Name: Jeremy RetortBrandon Gilbo MRN: [id number] DOB: 06/29/1986 Today's Date: 11/21/2013 Time: 1345-1400 PT Time Calculation (min): 15 min  PT Assessment / Plan / Recommendation History of Present Illness  28 y.o. male with h/o sickle cell disease, MoyaMoya syndrome (s/p intracerebral venous reconstruction), CVA, HIV admitted with R foot pain, swelling, rash.   Clinical Impression  *Pt admitted with *SCC, RLE pain*. Pt currently with functional limitations due to the deficits listed below (see PT Problem List).  Pt will benefit from skilled PT to increase their independence and safety with mobility to allow discharge to the venue listed below.   **    PT Assessment  Patient needs continued PT services    Follow Up Recommendations  No PT follow up    Does the patient have the potential to tolerate intense rehabilitation      Barriers to Discharge        Equipment Recommendations  Crutches    Recommendations for Other Services     Frequency Min 3X/week    Precautions / Restrictions Precautions Precautions: None Restrictions Weight Bearing Restrictions: No   Pertinent Vitals/Pain *9/10 pain R foot  Premedicated, pt has PCA, elevated BLEs**      Mobility  Bed Mobility Overal bed mobility: Independent Transfers Overall transfer level: Independent Ambulation/Gait Ambulation/Gait assistance: Supervision Ambulation Distance (Feet): 100 Feet Assistive device: Crutches Gait Pattern/deviations: Step-to pattern Gait velocity interpretation: Below normal speed for age/gender General Gait Details: pt unweighted RLE significantly, he walked NWB or TTWB with RLE; VCs for sequencing with crutches    Exercises     PT Diagnosis: Difficulty walking;Acute pain  PT Problem List: Decreased mobility;Pain;Decreased activity tolerance PT Treatment Interventions: DME instruction;Gait training;Stair training;Therapeutic exercise;Patient/family education      PT Goals(Current goals can be found in the care plan section) Acute Rehab PT Goals Patient Stated Goal: return to independence with mobility PT Goal Formulation: With patient Time For Goal Achievement: 12/05/13 Potential to Achieve Goals: Good  Visit Information  Last PT Received On: 11/21/13 Assistance Needed: +1 History of Present Illness: 28 y.o. male with h/o sickle cell disease, MoyaMoya syndrome (s/p intracerebral venous reconstruction), CVA, HIV admitted with R foot pain, swelling, rash.        Prior Functioning  Home Living Family/patient expects to be discharged to:: Private residence Living Arrangements: Parent Type of Home: Apartment Home Access: Stairs to enter Secretary/administratorntrance Stairs-Number of Steps: 2-3 Home Layout: One level Home Equipment: None Prior Function Level of Independence: Independent Communication Communication: No difficulties    Cognition  Cognition Arousal/Alertness: Awake/alert Behavior During Therapy: WFL for tasks assessed/performed Overall Cognitive Status: Within Functional Limits for tasks assessed    Extremity/Trunk Assessment Upper Extremity Assessment Upper Extremity Assessment: Overall WFL for tasks assessed Lower Extremity Assessment Lower Extremity Assessment: RLE deficits/detail RLE Deficits / Details: R ankle NT due to pain, knee/hip WFL RLE: Unable to fully assess due to pain Cervical / Trunk Assessment Cervical / Trunk Assessment: Normal   Balance Balance Overall balance assessment: Modified Independent  End of Session PT - End of Session Equipment Utilized During Treatment: Gait belt Activity Tolerance: Patient tolerated treatment well Patient left: in chair;with call bell/phone within reach Nurse Communication: Mobility status  GP     Ralene BatheUhlenberg, Taggert Bozzi Kistler 11/21/2013, 2:15 PM [phone]

## 2013-11-21 NOTE — Care Management Note (Addendum)
    Page 1 of 1   11/22/2013     1:50:31 PM   CARE MANAGEMENT NOTE 11/22/2013  Patient:  Jeremy Mclaughlin,Jeremy Mclaughlin   Account Number:  [id number]  Date Initiated:  11/21/2013  Documentation initiated by:  Lanier ClamMAHABIR,Necia Kamm  Subjective/Objective Assessment:   28 Y/O M ADMITTED W/SSC.READMIT [facility]SC-2/28-11/15/13.     Action/Plan:   FROM HOME.HAS PCP,PHARMACY.   Anticipated DC Date:  11/22/2013   Anticipated DC Plan:  HOME/SELF CARE      DC Planning Services  CM consult      Choice offered to / List presented to:             Status of service:  Completed, signed off Medicare Important Message given?   (If response is "NO", the following Medicare IM given date fields will be blank) Date Medicare IM given:   Date Additional Medicare IM given:    Discharge Disposition:  HOME/SELF CARE  Per UR Regulation:  Reviewed for med. necessity/level of care/duration of stay  If discussed at Long Length of Stay Meetings, dates discussed:    Comments:  11/21/13 Jb Dulworth RN,BSN NCM [phone] NO ANTICIPATED D/C NEEDS.

## 2013-11-21 NOTE — Progress Notes (Signed)
PHYSICIAN PROGRESS NOTE  Jeremy Mclaughlin ZOX:[id number] DOB: 05-Aug-1986 DOA: 11/19/2013  11/21/2013   PCP: MATTHEWS,MICHELLE A., MD  Admission History: 28 y.o. male with a PMH of sickle cell/hemoglobin SS disease, HIV (in 07/2013 CD4 was 750) on HAART, recent past admissions for sickle cell pain crisis who presented to [facility] ED 11/19/2013 with ongoing right ankle pain, swelling and scaly rash. He was discharged on antibiotics on previous admission for sinusitis. He reported having generalized body aches typical of his sickle cell pain crisis. Pain was not relieved with home analgesia. No fever or chest pain, no cough, no shortness of breath, palpitations. No abdominal pain, nausea or vomiting. No blood in stool or urine.   Assessment/Plan: 1. Acute HbSS vaso-occlusive pain crisis - Pt states that pain persists in knees and heavy PCA useage. He has used 14.1 mg, 49 demands, 47 deliveries in last 24 hours. Will continue PCA today as I have ordered for him to start physical therapy ambulation today.  Continue torodol for inflammation. Continue supportive therapy. Encouraged Incentive spirometry and ambulation. I reviewed records from Pt's hematologist in Care Everywhere from Gardiner Coins, MD and patient has been receiving monthly exchange transfusions.   2. Hypokalemia - replace orally and follow CMP 3. Chronic Anemia - hemoglobin holding at 7, will follow. Asymptomatic at this time.  No indication for transfusion. 4. HIV - Continue HAART therapy (Stribild), CD4 750 Nov 2014. Follow up with ID clinic 5. Right foot and ankle swelling and pain - Edema is much improved today.  LE doppler negative for DVT. No acute osseous findings on imaging. Will get ortho consult for eval if no improvement and not able to ambulate with PT assistance. Ordered PT eval.  6. History of MoyaMoya syndrome - Pt had been on aspirin for stroke prophylaxis, he reports that he was recently taken off this temporarily by his ID docs as he was  starting a new HIV medication (stribild) which he has been tolerating well. Will go ahead and restart.  7. Allergy to Heparins/Lovenox - SCDs ordered. PT eval ordered.   Code Status: FULL  HPI/Subjective: Pt was texting on phone and appeared comfortable when I walked in the room.  He then reported ongoing pain in knees and right foot. He says that he has pain in right foot and ankle when walking.    Objective: Filed Vitals:   11/21/13 0951  BP:   Pulse:   Temp:   Resp: 12    Intake/Output Summary (Last 24 hours) at 11/21/13 1018 Last data filed at 11/20/13 2354  Gross per 24 hour  Intake      0 ml  Output   2150 ml  Net  -2150 ml   Filed Weights   11/19/13 0132  Weight: 133 lb (60.328 kg)   Review of Systems - Negative except what is reported in HPI   Exam:  Constitutional: Appears well-developed and well-nourished. No distress.  HENT: Normocephalic. External right and left ear normal.  Eyes: bilateral scleral icterus. EOM are normal.  Neck: Normal ROM. Neck supple. No JVD. No tracheal deviation.  CVS: RRR, S1/S2 +, no murmurs, no gallops, no carotid bruit.  Pulmonary: Effort and breath sounds normal, no stridor, rhonchi, wheezes, rales.  Abdominal: Soft. BS +, no distension, tenderness, rebound or guarding.  Musculoskeletal: Normal range of motion. Mild right ankle and foot swelling improved, scaly rash right leg foot, small ulcer right ankle  Neuro: Alert. No focal neurologic deficits  Skin: rash noted above and ulcer  noted above Psychiatric: Flat affect slightly improved today.   Data Reviewed: Basic Metabolic Panel:  Recent Labs Lab 11/15/13 0421 11/19/13 0225 11/19/13 1721 11/21/13 0441  NA 137 137 136* 137  K 3.7 3.6* 3.7 3.5*  CL 100 101 100 96  CO2 29 28 27  32  GLUCOSE 88 93 89 88  BUN 5* 7 4* 5*  CREATININE 0.42* 0.50 0.46* 0.56  CALCIUM 8.9 8.8 8.7 9.0  MG  --   --  1.7  --    Liver Function Tests:  Recent Labs Lab 11/15/13 0421  11/19/13 0225 11/19/13 1721 11/21/13 0441  AST 60* 59* 49* 56*  ALT 61* 53 61* 54*  ALKPHOS 138* 135* 127* 139*  BILITOT 3.8* 2.1* 2.6* 3.4*  PROT 8.6* 9.5* 8.8* 9.4*  ALBUMIN 3.0* 3.2* 3.3* 3.4*   No results found for this basename: LIPASE, AMYLASE,  in the last 168 hours No results found for this basename: AMMONIA,  in the last 168 hours CBC:  Recent Labs Lab 11/15/13 0421 11/19/13 0225 11/19/13 1721 11/21/13 0441  WBC 6.8 8.4 10.2 7.8  NEUTROABS 3.4 2.7 4.7  --   HGB 7.6* 7.3* 7.2* 7.7*  HCT 22.8* 22.1* 21.9* 23.5*  MCV 87.4 89.1 87.6 87.7  PLT 339 362 358 430*   Cardiac Enzymes: No results found for this basename: CKTOTAL, CKMB, CKMBINDEX, TROPONINI,  in the last 168 hours BNP (last 3 results) No results found for this basename: PROBNP,  in the last 8760 hours CBG: No results found for this basename: GLUCAP,  in the last 168 hours  No results found for this or any previous visit (from the past 240 hour(s)).   Studies: No results found.  Scheduled Meds: . aspirin  81 mg Oral Daily  . clobetasol ointment  1 application Topical BID  . elvitegravir-cobicistat-emtricitabine-tenofovir  1 tablet Oral Q breakfast  . folic acid  1 mg Oral q morning - 10a  . HYDROcodone-acetaminophen  1 tablet Oral Q6H  . HYDROmorphone PCA 0.3 mg/mL   Intravenous 6 times per day  . ketorolac  30 mg Intravenous 4 times per day  . paraben-cetyl & stearyl alcohol  1 application Topical Daily  . senna  2 tablet Oral BID  . vitamin C  1,000 mg Oral QODAY   Continuous Infusions:   Active Problems:   Sickle cell pain crisis   Human immunodeficiency virus (HIV) disease   Anemia   Hypokalemia   Right ankle pain   Ulcer of right ankle  Neizan Debruhl [facility]  [facility] Pager [phone]. If 7PM-7AM, please contact night-coverage at [email or web address], password [email or web address] 11/21/2013, 10:18 AM  LOS: 2 days

## 2013-11-21 NOTE — Consult Note (Addendum)
WOC wound consult note Reason for Consult: Consult requested for right ankle wound.  Pt has dry scaly rash and patchy areas of scabs to right lower calf.  He states this is some form of psoriasis and he uses Temovate cream to reduce scabbing and itching which has already been ordered by primary team. This rash is [facility] scope [facility] practice;  Recommend follow-up after discharge to a  Dermatologist.  He has recently developed a wound to right outer ankle. Wound type: Full thickness Measurement: .3X.3X.2cm Wound bed: 95% red and moist, 5% yellow Drainage (amount, consistency, odor) small amt pink drainage Periwound: intact skin surrounding Dressing procedure/placement/frequency: Bactroban and bandaid to provide antimicrobial benefits and promote healing.  Discussed plan of care with patient for use at home and he appears to understand. Please re-consult if further assistance is needed.  Thank-you,  Cammie Mcgeeawn Abron Neddo MSN, RN, CWOCN, [place]CN-AP, CNS [phone]

## 2013-11-22 DIAGNOSIS — M25473 Effusion, unspecified ankle: Secondary | ICD-10-CM

## 2013-11-22 DIAGNOSIS — M25476 Effusion, unspecified foot: Secondary | ICD-10-CM

## 2013-11-22 LAB — COMPREHENSIVE METABOLIC PANEL
ALK PHOS: 146 U/L — AB (ref 39–117)
ALT: 45 U/L (ref 0–53)
AST: 53 U/L — ABNORMAL HIGH (ref 0–37)
Albumin: 3.4 g/dL — ABNORMAL LOW (ref 3.5–5.2)
BUN: 7 mg/dL (ref 6–23)
CALCIUM: 9 mg/dL (ref 8.4–10.5)
CO2: 34 meq/L — AB (ref 19–32)
Chloride: 98 mEq/L (ref 96–112)
Creatinine, Ser: 0.54 mg/dL (ref 0.50–1.35)
GLUCOSE: 95 mg/dL (ref 70–99)
Potassium: 3.9 mEq/L (ref 3.7–5.3)
Sodium: 138 mEq/L (ref 137–147)
Total Bilirubin: 3.2 mg/dL — ABNORMAL HIGH (ref 0.3–1.2)
Total Protein: 9.3 g/dL — ABNORMAL HIGH (ref 6.0–8.3)

## 2013-11-22 LAB — CBC
HCT: 24.6 % — ABNORMAL LOW (ref 39.0–52.0)
HEMOGLOBIN: 8.4 g/dL — AB (ref 13.0–17.0)
MCH: 29.4 pg (ref 26.0–34.0)
MCHC: 34.1 g/dL (ref 30.0–36.0)
MCV: 86 fL (ref 78.0–100.0)
Platelets: 468 10*3/uL — ABNORMAL HIGH (ref 150–400)
RBC: 2.86 MIL/uL — AB (ref 4.22–5.81)
RDW: 19.3 % — ABNORMAL HIGH (ref 11.5–15.5)
WBC: 7.4 10*3/uL (ref 4.0–10.5)

## 2013-11-22 LAB — DIFFERENTIAL
BASOS ABS: 0.1 10*3/uL (ref 0.0–0.1)
Basophils Relative: 1 % (ref 0–1)
Eosinophils Absolute: 0.2 10*3/uL (ref 0.0–0.7)
Eosinophils Relative: 3 % (ref 0–5)
LYMPHS ABS: 3.4 10*3/uL (ref 0.7–4.0)
LYMPHS PCT: 45 % (ref 12–46)
Monocytes Absolute: 0.7 10*3/uL (ref 0.1–1.0)
Monocytes Relative: 10 % (ref 3–12)
NEUTROS ABS: 3 10*3/uL (ref 1.7–7.7)
Neutrophils Relative %: 41 % — ABNORMAL LOW (ref 43–77)

## 2013-11-22 LAB — MAGNESIUM: Magnesium: 1.7 mg/dL (ref 1.5–2.5)

## 2013-11-22 MED ORDER — MUPIROCIN CALCIUM 2 % EX CREA: TOPICAL_CREAM | Freq: Every day | CUTANEOUS | Status: DC

## 2013-11-22 NOTE — Discharge Summary (Signed)
Jeremy Mclaughlin MRN: [id number] DOB/AGE: 27/03/1986 27 y.o.  Admit date: 11/19/2013 Discharge date: 11/22/2013  Primary Care Physician:  Lasha Echeverria A., MD   Discharge Diagnoses:   Patient Active Problem List   Diagnosis Date Noted  . Right ankle pain 11/20/2013  . Ulcer of right ankle 11/20/2013  . Hypokalemia 11/19/2013  . Sickle cell pain crisis 03/08/2013  . History of Moyamoya syndrome 03/08/2013  . Human immunodeficiency virus (HIV) disease 03/08/2013  . Anemia 03/08/2013    DISCHARGE MEDICATION:   Medication List    STOP taking these medications       amoxicillin-clavulanate 875-125 MG per tablet  Commonly known as:  AUGMENTIN      TAKE these medications       aspirin EC 81 MG tablet  Take 81 mg by mouth.     cetaphil lotion  Apply 1 application topically 2 (two) times daily.     clobetasol ointment 0.05 %  Commonly known as:  TEMOVATE  Apply 1 application topically 2 (two) times daily.     diphenhydrAMINE 25 mg capsule  Commonly known as:  BENADRYL  Take 25 mg by mouth every 6 (six) hours as needed.     elvitegravir-cobicistat-emtricitabine-tenofovir 150-150-200-300 MG Tabs tablet  Commonly known as:  STRIBILD  Take 1 tablet by mouth daily.     folic acid 1 MG tablet  Commonly known as:  FOLVITE  Take 1 mg by mouth every morning.     HYDROcodone-acetaminophen 10-500 MG per tablet  Commonly known as:  LORTAB  Take 1 tablet by mouth every 6 (six) hours as needed for pain.     mupirocin cream 2 %  Commonly known as:  BACTROBAN  Apply topically daily.     paraben-cetyl & stearyl alcohol external solution  Apply 1 application topically daily.     promethazine 25 MG tablet  Commonly known as:  PHENERGAN  Take 25 mg by mouth every 6 (six) hours as needed for nausea.     senna 8.6 MG Tabs tablet  Commonly known as:  SENOKOT  Take 2 tablets by mouth 2 (two) times daily.     vitamin C 1000 MG tablet  Take 1,000 mg by mouth every other day.           Consults:     SIGNIFICANT DIAGNOSTIC STUDIES:  Dg Chest 2 View  11/19/2013   CLINICAL DATA:  Sickle cell pain crisis  EXAM: CHEST  2 VIEW  COMPARISON:  Prior radiograph from 11/13/2013  FINDINGS: Cardiomegaly is stable as compared to the prior exam.  The lungs are normally inflated. There is mild diffuse pulmonary vascular congestion without overt pulmonary edema. No focal infiltrate identified. No pleural effusion. No pneumothorax.  No acute osseous abnormality identified.  IMPRESSION: Stable cardiomegaly with mild diffuse pulmonary vascular congestion without overt pulmonary edema. No focal infiltrates identified.   Electronically Signed   By: Rise MuBenjamin  McClintock M.D.   On: 11/19/2013 06:49   Dg Chest 2 View  11/13/2013   CLINICAL DATA:  Sickle cell pain crisis, chest pain  EXAM: CHEST  2 VIEW  COMPARISON:  CT ANGIO CHEST W/CM &/OR WO/CM dated 08/25/2013; DG CHEST 2 VIEW dated 08/25/2013; DG CHEST 2 VIEW dated 03/08/2013  FINDINGS: Grossly unchanged enlarged cardiac silhouette. Normal mediastinal contours. Interval development of small bilateral effusions and associated bibasilar heterogeneous opacities. There is pleural parenchymal thickening about the bilateral major in the right [place] fissures. No definite evidence of edema. No pneumothorax. Unchanged bones. Post cholecystectomy.  IMPRESSION: 1. Interval development small bilateral effusions and associated bibasilar opacities, atelectasis versus infiltrate. 2. Cardiomegaly without definite evidence of edema.   Electronically Signed   By: Simonne Come M.D.   On: 11/13/2013 11:28   Dg Ankle Complete Right  11/19/2013   CLINICAL DATA:  Sickle cell pain crisis. Pain and swelling. No injury.  EXAM: RIGHT ANKLE - COMPLETE 3+ VIEW  COMPARISON:  None.  FINDINGS: There is soft tissue swelling about the ankle, worse laterally where there is also mild irregularity of the skin. No acute osseous abnormality such as fracture, visible bone infection, or  visible bone infarct.  IMPRESSION: Soft tissue swelling around the ankle.  No acute osseous findings.   Electronically Signed   By: Tiburcio Pea M.D.   On: 11/19/2013 04:03     No results found for this or any previous visit (from the past 240 hour(s)).  BRIEF ADMITTING H & P: 28 y.o. male with a PMH of sickle cell/hemoglobin SS disease, HIV (in 07/2013 CD4 was 750) on HAART, recent past admissions for sickle cell pain crisis who presented to [facility] ED 11/19/2013 with ongoing right ankle pain, swelling and scaly rash. He was apparently discharged on antibiotic on previous admission but per EPIC review this was for sinusitis and there was no real concern for cellulitis. He additionally reports having generalized body aches typical of his sickle cell pain crisis. He states that his pain was minimal and that he hadn't really tried to treat his pain at home as he was coming to the ED for the leg swelling.   He denies fever, chest pain,  cough,  shortness of breath, palpitations, abdominal pain, nausea or vomiting. No blood in stool or urine.    Hospital Course:  Present on Admission:  . Right ankle pain and swelling: Pt presented to the ED mainly for swelling in his RLE which he states was concerning to him for a DVT. He had been r/o with a LE doppler for DVT just the week before. The admitting Physician felt that he had soft tissue swelling around the ankle. The swelling resolved with use of NSAID's and pt patient has no limitation of movement or ambulation. This was likely dur to a Kinkaid mechanical injury.  Marland Kitchen Ulcer of right ankle: The patient has a small full thickness wound on the right leg just above the ankle. He was seen by wound care nurse who recommended Bactroban and Band-aid. Pt is being discharged with those instructions.  . (Resolved) Sickle cell crisis: Pt had a very Diesel crisis which could probably have been treated at home if he had utilized his medications ATC. However his pain escalated  more than likely because of delay in treatment. I have spoken with the patient about the importance of treating his pain early and aggressively within the limitations of his analgesic prescription. He has used only minimal amounts on the PCa and reports that his pain is miniscule and has been for most of the day.  . Human immunodeficiency virus (HIV) disease: Pt has an upcoming appointment with Dr. Luciana Axe on 12/06/2013. Continue HAART therapy.  . Anemia: Hb stable. He had no need for transfusions during this hospitalization. He is on a serial Red Cell Pheresis program every 6 weeks at [facility]. His next appointment is on 12/19/2013.  Marland Kitchen Hypokalemia: replaced orally. Potassium at time of discharge was 3.9.    Disposition and Follow-up:  Pt stable at time of discharge and has a  follow-up appointment for primary care on 12/15/2013.     Discharge Orders   Future Appointments Provider Department Dept Phone   12/06/2013 10:30 AM Gardiner Barefoot, MD [facility] [phone]   12/15/2013 11:00 AM Altha Harm, MD [facility] [phone]   Future Orders Complete By Expires   Activity as tolerated - No restrictions  As directed    Diet general  As directed    Discharge wound care:  As directed    Comments:     Apply Bactroban and then cover with band-aid.      DISCHARGE EXAM:  General: Alert, awake, oriented x3, in no acute distress.  Vital Signs: BP 138/86, HR 74, T 98.1 F (36.7 C), temperature source Oral, RR 14, height 5\' 5"  (1.651 m), weight 133 lb (60.328 kg), SpO2 100.00%. HEENT: [place]/AT PEERL, EOMI, anicteric  Neck: Trachea midline, no masses, no thyromegal,y no JVD, no carotid bruit  OROPHARYNX: Moist, No exudate/ erythema/lesions.  Heart: Tachycardic with regular rhythm, without murmurs, rubs, gallops.  Lungs: Clear to auscultation, no wheezing or rhonchi noted.  Abdomen: Soft, nontender, nondistended,  positive bowel sounds, no masses no hepatosplenomegaly noted.  Neuro: No focal neurological deficits noted cranial nerves II through XII grossly intact. Strength functional in bilateral upper and lower extremities.  Musculoskeletal: No warm swelling or erythema around joints, no spinal tenderness noted.  Skin: Pt has patches of dried scaly rash on BLE's. He has a a small full thickness wound on the medial portion of the RLE just above the ankle.     Recent Labs  11/19/13 1721 11/21/13 0441 11/22/13 0515  NA 136* 137 138  K 3.7 3.5* 3.9  CL 100 96 98  CO2 27 32 34*  GLUCOSE 89 88 95  BUN 4* 5* 7  CREATININE 0.46* 0.56 0.54  CALCIUM 8.7 9.0 9.0  MG 1.7  --  1.7    Recent Labs  11/21/13 0441 11/22/13 0515  AST 56* 53*  ALT 54* 45  ALKPHOS 139* 146*  BILITOT 3.4* 3.2*  PROT 9.4* 9.3*  ALBUMIN 3.4* 3.4*   No results found for this basename: LIPASE, AMYLASE,  in the last 72 hours  Recent Labs  11/19/13 1721 11/21/13 0441 11/22/13 0515  WBC 10.2 7.8 7.4  NEUTROABS 4.7  --  3.0  HGB 7.2* 7.7* 8.4*  HCT 21.9* 23.5* 24.6*  MCV 87.6 87.7 86.0  PLT 358 430* 468*    Total time spent in discharge including face to face and decision making was greater than 30 minutes.  Signed: Lela Murfin A. 11/22/2013, 1:34 PM

## 2013-12-06 ENCOUNTER — Telehealth: Payer: Self-pay | Admitting: Internal Medicine

## 2013-12-06 ENCOUNTER — Ambulatory Visit (INDEPENDENT_AMBULATORY_CARE_PROVIDER_SITE_OTHER): Payer: Medicaid Other | Admitting: Internal Medicine

## 2013-12-06 ENCOUNTER — Encounter: Payer: Self-pay | Admitting: Internal Medicine

## 2013-12-06 VITALS — BP 137/77 | HR 92 | Temp 98.3°F | Ht 64.0 in | Wt 134.0 lb

## 2013-12-06 DIAGNOSIS — B20 Human immunodeficiency virus [HIV] disease: Secondary | ICD-10-CM

## 2013-12-06 LAB — COMPLETE METABOLIC PANEL WITH GFR
ALT: 26 U/L (ref 0–53)
AST: 48 U/L — ABNORMAL HIGH (ref 0–37)
Albumin: 4.2 g/dL (ref 3.5–5.2)
Alkaline Phosphatase: 80 U/L (ref 39–117)
BILIRUBIN TOTAL: 4.1 mg/dL — AB (ref 0.2–1.2)
BUN: 8 mg/dL (ref 6–23)
CO2: 28 meq/L (ref 19–32)
CREATININE: 0.58 mg/dL (ref 0.50–1.35)
Calcium: 9.3 mg/dL (ref 8.4–10.5)
Chloride: 102 mEq/L (ref 96–112)
GLUCOSE: 87 mg/dL (ref 70–99)
Potassium: 5.2 mEq/L (ref 3.5–5.3)
Sodium: 134 mEq/L — ABNORMAL LOW (ref 135–145)
Total Protein: 10.4 g/dL — ABNORMAL HIGH (ref 6.0–8.3)

## 2013-12-06 NOTE — Assessment & Plan Note (Signed)
Doing well, can continue with phenergan as needed.  If labs ok today, rtc 3 months.

## 2013-12-06 NOTE — Telephone Encounter (Signed)
  INTERNAL MEDICINE RESIDENCY PROGRAM After-Hours Telephone Call    Reason for call:   I received a call from lab [facility] about the elevate total protein level of 10.4. Patient has had a chronic elevated total protein level ranging at 9-10.7. He has been followed by his PCP and ID ( hx of HIV).      Assessment/ Plan:   I will send the inbasket message to ID physician who ordered lab on 12/06/13.   As always, pt is advised that if symptoms worsen or new symptoms arise, they should go to an urgent care facility or to to ER for further evaluation.    Dede QueryNa Travaris Kosh, MD   12/06/2013, 10:17 PM

## 2013-12-06 NOTE — Progress Notes (Signed)
   Subjective:    Patient ID: Jeremy Mclaughlin, male    DOB: 10/18/1985, 28 y.o.   MRN: [id number]  HPI Here for follow up of HIV.  Started on Stribild in February and with a little nausea but is well controlled with some phenergan.  Takes well and no missed doses.  Recently with SS crisis x 2 but doing better now.     Review of Systems  Constitutional: Negative for fever and fatigue.  HENT: Negative for sore throat.   Respiratory: Negative for cough, shortness of breath and wheezing.   Gastrointestinal: Positive for nausea. Negative for diarrhea.       Nausea controlled with phenergan  Skin: Negative for rash.  Neurological: Negative for dizziness and light-headedness.       Objective:   Physical Exam  Constitutional: He appears well-developed and well-nourished. No distress.  HENT:  Mouth/Throat: No oropharyngeal exudate.  Eyes: Right eye exhibits no discharge. Left eye exhibits no discharge. No scleral icterus.  Cardiovascular: Normal rate, regular rhythm and normal heart sounds.   No murmur heard. Pulmonary/Chest: Effort normal and breath sounds normal. No respiratory distress. He has no wheezes.  Musculoskeletal: He exhibits no edema.  Lymphadenopathy:    He has no cervical adenopathy.  Skin: No rash noted.          Assessment & Plan:

## 2013-12-07 LAB — T-HELPER CELL (CD4) - (RCID CLINIC ONLY)
CD4 T CELL ABS: 510 /uL (ref 400–2700)
CD4 T CELL HELPER: 16 % — AB (ref 33–55)

## 2013-12-07 LAB — HIV-1 RNA QUANT-NO REFLEX-BLD
HIV 1 RNA Quant: <20 copies/mL (ref <20)
HIV-1 RNA Quant, Log: <1.3 {Log} (ref <1.30)

## 2013-12-15 ENCOUNTER — Ambulatory Visit: Payer: Medicaid Other | Admitting: Internal Medicine

## 2014-01-11 ENCOUNTER — Encounter: Payer: Self-pay | Admitting: Internal Medicine

## 2014-03-08 ENCOUNTER — Encounter: Payer: Self-pay | Admitting: Internal Medicine

## 2014-03-08 ENCOUNTER — Ambulatory Visit (INDEPENDENT_AMBULATORY_CARE_PROVIDER_SITE_OTHER): Payer: Medicaid Other | Admitting: Internal Medicine

## 2014-03-08 VITALS — BP 131/88 | HR 73 | Temp 98.9°F | Ht 65.0 in | Wt 134.0 lb

## 2014-03-08 DIAGNOSIS — R110 Nausea: Secondary | ICD-10-CM

## 2014-03-08 DIAGNOSIS — B20 Human immunodeficiency virus [HIV] disease: Secondary | ICD-10-CM

## 2014-03-08 LAB — COMPLETE METABOLIC PANEL WITH GFR
ALK PHOS: 68 U/L (ref 39–117)
ALT: 15 U/L (ref 0–53)
AST: 34 U/L (ref 0–37)
Albumin: 4.4 g/dL (ref 3.5–5.2)
BILIRUBIN TOTAL: 5.4 mg/dL — AB (ref 0.2–1.2)
BUN: 8 mg/dL (ref 6–23)
CALCIUM: 9.3 mg/dL (ref 8.4–10.5)
CO2: 27 mEq/L (ref 19–32)
CREATININE: 0.66 mg/dL (ref 0.50–1.35)
Chloride: 103 mEq/L (ref 96–112)
GFR, Est African American: >89 mL/min
GFR, Est Non African American: >89 mL/min
Glucose, Bld: 86 mg/dL (ref 70–99)
Potassium: 4.8 mEq/L (ref 3.5–5.3)
Sodium: 135 mEq/L (ref 135–145)
Total Protein: 9.2 g/dL — ABNORMAL HIGH (ref 6.0–8.3)

## 2014-03-08 NOTE — Addendum Note (Signed)
Addended by: Andree CossHOWELL, Mariama Saintvil M on: 03/08/2014 02:54 PM   Modules accepted: Orders

## 2014-03-08 NOTE — Progress Notes (Signed)
   Subjective:    Patient ID: Jeremy Mclaughlin, male    DOB: 10/27/1985, 28 y.o.   MRN: [id number]  HPI  Here for follow up of HIV.  Started on Stribild in February 2015 and with a little nausea but is well controlled with some phenergan.  Takes well and no missed doses.  Denies diarrhea.    CD4 last visit 510 with undetectable vl.     Review of Systems  Constitutional: Negative for fever and fatigue.  HENT: Negative for sore throat.   Respiratory: Negative for cough, shortness of breath and wheezing.   Gastrointestinal: Positive for nausea. Negative for diarrhea.       Nausea controlled with phenergan  Skin: Negative for rash.  Neurological: Negative for dizziness and light-headedness.       Objective:   Physical Exam  Constitutional: He appears well-developed and well-nourished. No distress.  HENT:  Mouth/Throat: No oropharyngeal exudate.  Eyes: Right eye exhibits no discharge. Left eye exhibits no discharge. No scleral icterus.  Cardiovascular: Normal rate, regular rhythm and normal heart sounds.   No murmur heard. Pulmonary/Chest: Effort normal and breath sounds normal. No respiratory distress. He has no wheezes.  Musculoskeletal: He exhibits no edema.  Lymphadenopathy:    He has no cervical adenopathy.  Skin: No rash noted.          Assessment & Plan:

## 2014-03-08 NOTE — Assessment & Plan Note (Addendum)
Doing well, can continue with phenergan as needed.  If labs ok today, rtc 3 months.  Will check hep A and B labs to see if immune.

## 2014-03-08 NOTE — Assessment & Plan Note (Signed)
Still has the nausea that started after starting the medicine.  Will continue for now and consider change next visit if persistent.  No concerning signs with no weight loss.

## 2014-03-09 LAB — HEPATITIS B SURFACE ANTIBODY,QUALITATIVE: HEP B S AB: NEGATIVE

## 2014-03-09 LAB — HEPATITIS B CORE ANTIBODY, TOTAL: HEP B C TOTAL AB: NONREACTIVE

## 2014-03-09 LAB — HEPATITIS B SURFACE ANTIGEN: HEP B S AG: NEGATIVE

## 2014-03-09 LAB — HEPATITIS A ANTIBODY, TOTAL: HEP A TOTAL AB: NONREACTIVE

## 2014-03-10 LAB — HIV-1 RNA QUANT-NO REFLEX-BLD: HIV 1 RNA Quant: <20 copies/mL (ref <20)

## 2014-03-14 LAB — HLA B*5701: HLA-B 5701 W/RFLX HLA-B HIGH: NEGATIVE

## 2014-03-26 ENCOUNTER — Emergency Department (HOSPITAL_COMMUNITY)
Admission: EM | Admit: 2014-03-26 | Discharge: 2014-03-26 | Disposition: A | Discharge status: Home / Self Care | Payer: Medicaid Other | Attending: Emergency Medicine | Admitting: Emergency Medicine

## 2014-03-26 ENCOUNTER — Emergency Department (HOSPITAL_COMMUNITY): Payer: Medicaid Other

## 2014-03-26 ENCOUNTER — Encounter (HOSPITAL_COMMUNITY): Payer: Self-pay | Admitting: Emergency Medicine

## 2014-03-26 DIAGNOSIS — Z8673 Personal history of transient ischemic attack (TIA), and cerebral infarction without residual deficits: Secondary | ICD-10-CM | POA: Insufficient documentation

## 2014-03-26 DIAGNOSIS — S79919A Unspecified injury of unspecified hip, initial encounter: Secondary | ICD-10-CM | POA: Diagnosis not present

## 2014-03-26 DIAGNOSIS — IMO0002 Reserved for concepts with insufficient information to code with codable children: Secondary | ICD-10-CM | POA: Insufficient documentation

## 2014-03-26 DIAGNOSIS — Z79899 Other long term (current) drug therapy: Secondary | ICD-10-CM | POA: Insufficient documentation

## 2014-03-26 DIAGNOSIS — Z87891 Personal history of nicotine dependence: Secondary | ICD-10-CM | POA: Insufficient documentation

## 2014-03-26 DIAGNOSIS — Z21 Asymptomatic human immunodeficiency virus [HIV] infection status: Secondary | ICD-10-CM | POA: Diagnosis not present

## 2014-03-26 DIAGNOSIS — S79929A Unspecified injury of unspecified thigh, initial encounter: Secondary | ICD-10-CM

## 2014-03-26 DIAGNOSIS — Z8619 Personal history of other infectious and parasitic diseases: Secondary | ICD-10-CM | POA: Insufficient documentation

## 2014-03-26 DIAGNOSIS — D571 Sickle-cell disease without crisis: Secondary | ICD-10-CM | POA: Insufficient documentation

## 2014-03-26 DIAGNOSIS — Z862 Personal history of diseases of the blood and blood-forming organs and certain disorders involving the immune mechanism: Secondary | ICD-10-CM | POA: Diagnosis not present

## 2014-03-26 DIAGNOSIS — Z8639 Personal history of other endocrine, nutritional and metabolic disease: Secondary | ICD-10-CM | POA: Insufficient documentation

## 2014-03-26 LAB — CBC WITH DIFFERENTIAL/PLATELET
Basophils Absolute: 0.1 10*3/uL (ref 0.0–0.1)
Basophils Relative: 1 % (ref 0–1)
Eosinophils Absolute: 0.2 10*3/uL (ref 0.0–0.7)
Eosinophils Relative: 2 % (ref 0–5)
HCT: 23.5 % — ABNORMAL LOW (ref 39.0–52.0)
Hemoglobin: 8 g/dL — ABNORMAL LOW (ref 13.0–17.0)
Lymphocytes Relative: 34 % (ref 12–46)
Lymphs Abs: 3.7 10*3/uL (ref 0.7–4.0)
MCH: 29.9 pg (ref 26.0–34.0)
MCHC: 34 g/dL (ref 30.0–36.0)
MCV: 87.7 fL (ref 78.0–100.0)
Monocytes Absolute: 1.2 10*3/uL — ABNORMAL HIGH (ref 0.1–1.0)
Monocytes Relative: 11 % (ref 3–12)
Neutro Abs: 5.6 10*3/uL (ref 1.7–7.7)
Neutrophils Relative %: 52 % (ref 43–77)
Platelets: 367 10*3/uL (ref 150–400)
RBC: 2.68 MIL/uL — ABNORMAL LOW (ref 4.22–5.81)
RDW: 19.2 % — ABNORMAL HIGH (ref 11.5–15.5)
WBC: 10.8 10*3/uL — ABNORMAL HIGH (ref 4.0–10.5)

## 2014-03-26 LAB — I-STAT TROPONIN, ED: Troponin i, poc: 0.01 ng/mL (ref 0.00–0.08)

## 2014-03-26 LAB — URINALYSIS, ROUTINE W REFLEX MICROSCOPIC
Bilirubin Urine: NEGATIVE
GLUCOSE, UA: NEGATIVE mg/dL
HGB URINE DIPSTICK: NEGATIVE
Ketones, ur: NEGATIVE mg/dL
Leukocytes, UA: NEGATIVE
Nitrite: NEGATIVE
PH: 6.5 (ref 5.0–8.0)
Protein, ur: NEGATIVE mg/dL
Specific Gravity, Urine: >1.046 — ABNORMAL HIGH (ref 1.005–1.030)
Urobilinogen, UA: 4 mg/dL — ABNORMAL HIGH (ref 0.0–1.0)

## 2014-03-26 LAB — BASIC METABOLIC PANEL
Anion gap: 11 (ref 5–15)
BUN: 8 mg/dL (ref 6–23)
CO2: 26 mEq/L (ref 19–32)
Calcium: 9.3 mg/dL (ref 8.4–10.5)
Chloride: 99 mEq/L (ref 96–112)
Creatinine, Ser: 0.66 mg/dL (ref 0.50–1.35)
GFR calc Af Amer: >90 mL/min (ref >90)
GFR calc non Af Amer: >90 mL/min (ref >90)
Glucose, Bld: 100 mg/dL — ABNORMAL HIGH (ref 70–99)
Potassium: 4.2 mEq/L (ref 3.7–5.3)
Sodium: 136 mEq/L — ABNORMAL LOW (ref 137–147)

## 2014-03-26 MED ORDER — OXYCODONE-ACETAMINOPHEN 5-325 MG PO TABS
2.0000 | ORAL_TABLET | Freq: Once | ORAL | Status: AC
  Administered 2014-03-26: 2 via ORAL
  Filled 2014-03-26: qty 2

## 2014-03-26 MED ORDER — HYDROMORPHONE HCL PF 1 MG/ML IJ SOLN
1.0000 mg | Freq: Once | INTRAMUSCULAR | Status: AC
  Administered 2014-03-26: 1 mg via INTRAVENOUS
  Filled 2014-03-26: qty 1

## 2014-03-26 MED ORDER — MORPHINE SULFATE 4 MG/ML IJ SOLN
4.0000 mg | Freq: Once | INTRAMUSCULAR | Status: DC
Start: 2014-03-26 — End: 2014-03-26
  Filled 2014-03-26: qty 1

## 2014-03-26 MED ORDER — DIAZEPAM 5 MG PO TABS
5.0000 mg | ORAL_TABLET | Freq: Once | ORAL | Status: AC
  Administered 2014-03-26: 5 mg via ORAL
  Filled 2014-03-26: qty 1

## 2014-03-26 MED ORDER — IOHEXOL 300 MG/ML  SOLN
100.0000 mL | Freq: Once | INTRAMUSCULAR | Status: AC | PRN
  Administered 2014-03-26: 100 mL via INTRAVENOUS
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
## 2014-03-26 NOTE — ED Notes (Signed)
Patient resting in position of comfort with eyes closed after admin of pain medication, see MAR RR WNL--even and unlabored with equal rise and fall of chest Patient in NAD Side rails up, call bell in reach  
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
## 2014-03-26 NOTE — ED Notes (Signed)
Pt states that he is not able to provide urine but is aware that a sample is needed
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
## 2014-03-26 NOTE — ED Notes (Signed)
Patient asking for additional pain medication--EDP made aware 

## 2014-03-26 NOTE — ED Notes (Addendum)
Patient informed of order for urine specimen from three hours ago Patient states that he does not feel he is able to urinate Patient informed that per MD, in and out cath should be performed Patient now states that he will try to provide specimen via urinal
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
## 2014-04-13 ENCOUNTER — Encounter: Payer: Self-pay | Admitting: Internal Medicine

## 2014-04-13 ENCOUNTER — Ambulatory Visit (INDEPENDENT_AMBULATORY_CARE_PROVIDER_SITE_OTHER): Payer: Medicaid Other | Admitting: Internal Medicine

## 2014-04-13 VITALS — BP 128/66 | HR 87 | Temp 98.4°F | Resp 14 | Ht 65.0 in | Wt 130.0 lb

## 2014-04-13 DIAGNOSIS — S322XXA Fracture of coccyx, initial encounter for closed fracture: Secondary | ICD-10-CM

## 2014-04-13 DIAGNOSIS — S3210XA Unspecified fracture of sacrum, initial encounter for closed fracture: Secondary | ICD-10-CM | POA: Insufficient documentation

## 2014-04-13 NOTE — ED Provider Notes (Signed)
Medical screening examination/treatment/procedure(s) were conducted as a shared visit with non-physician practitioner(s) and myself.  I personally evaluated the patient during the encounter.   EKG Interpretation None       Ryelynn Guedea, MD 04/13/14 1718 
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
## 2014-04-14 LAB — VITAMIN D 25 HYDROXY (VIT D DEFICIENCY, FRACTURES): Vit D, 25-Hydroxy: 12 ng/mL — ABNORMAL LOW (ref 30–89)

## 2014-04-19 ENCOUNTER — Telehealth: Payer: Self-pay | Admitting: *Deleted

## 2014-04-19 DIAGNOSIS — B20 Human immunodeficiency virus [HIV] disease: Secondary | ICD-10-CM

## 2014-04-19 MED ORDER — ELVITEG-COBIC-EMTRICIT-TENOFDF 150-150-200-300 MG PO TABS: 1.0000 | ORAL_TABLET | Freq: Every day | ORAL | Status: DC

## 2014-04-19 NOTE — Telephone Encounter (Signed)
Patient called to give new contact information and address as well as pharmacy info he has moved. Added new info to the chart and will send Rx to pharmacy.

## 2014-05-18 ENCOUNTER — Other Ambulatory Visit: Payer: Self-pay

## 2014-05-18 DIAGNOSIS — B20 Human immunodeficiency virus [HIV] disease: Secondary | ICD-10-CM

## 2014-05-18 MED ORDER — ELVITEG-COBIC-EMTRICIT-TENOFDF 150-150-200-300 MG PO TABS: 1.0000 | ORAL_TABLET | Freq: Every day | ORAL | Status: DC

## 2014-05-25 ENCOUNTER — Other Ambulatory Visit: Payer: Self-pay | Admitting: *Deleted

## 2014-05-25 ENCOUNTER — Telehealth: Payer: Self-pay | Admitting: *Deleted

## 2014-05-25 DIAGNOSIS — B20 Human immunodeficiency virus [HIV] disease: Secondary | ICD-10-CM

## 2014-05-25 MED ORDER — ELVITEG-COBIC-EMTRICIT-TENOFDF 150-150-200-300 MG PO TABS: 1.0000 | ORAL_TABLET | Freq: Every day | ORAL | Status: AC

## 2014-05-25 NOTE — Telephone Encounter (Signed)
Patient unable to fill his medication through [facility]. He has [facility] in [place] and needs to use [facility] [phone]. Set up account and Rx called in. ID # [id number]

## 2014-05-29 NOTE — Telephone Encounter (Addendum)
MD needs to sign PA paperwork prior to faxing to [facility].  Signed paperwork faxed to [facility].  [facility] would like to be notified when/if the authorization is approved.  Paperwork placed in [employer] on the [employer].

## 2014-05-30 ENCOUNTER — Telehealth: Payer: Self-pay | Admitting: *Deleted

## 2014-05-30 NOTE — Telephone Encounter (Signed)
PA approval obtained for [facility] - pt/[facility]. Notified.  [facility] to contact pt to set-up delivery.

## 2014-05-30 NOTE — Telephone Encounter (Signed)
Requesting info on HIV office in [place].  Pt given information for [facility], [address], [place], [place] [address], [phone].  Pt agreed to call [facility] to let us know the name of the provider he is going to see.  [facility] than can make referral to that MD.

## 2014-06-08 ENCOUNTER — Ambulatory Visit: Payer: Medicaid Other | Admitting: Internal Medicine

## 2014-10-16 ENCOUNTER — Encounter: Payer: Medicaid Other | Admitting: Internal Medicine

## 2014-11-21 IMAGING — CR DG CHEST 2V
2 series · 2 of 2 positions shown · non-contrast
Comparison: 03/08/2013

CLINICAL DATA: Chest pain today, history sickle cell anemia, HIV,
stroke

EXAM:
CHEST  2 VIEW

[w chest pa]
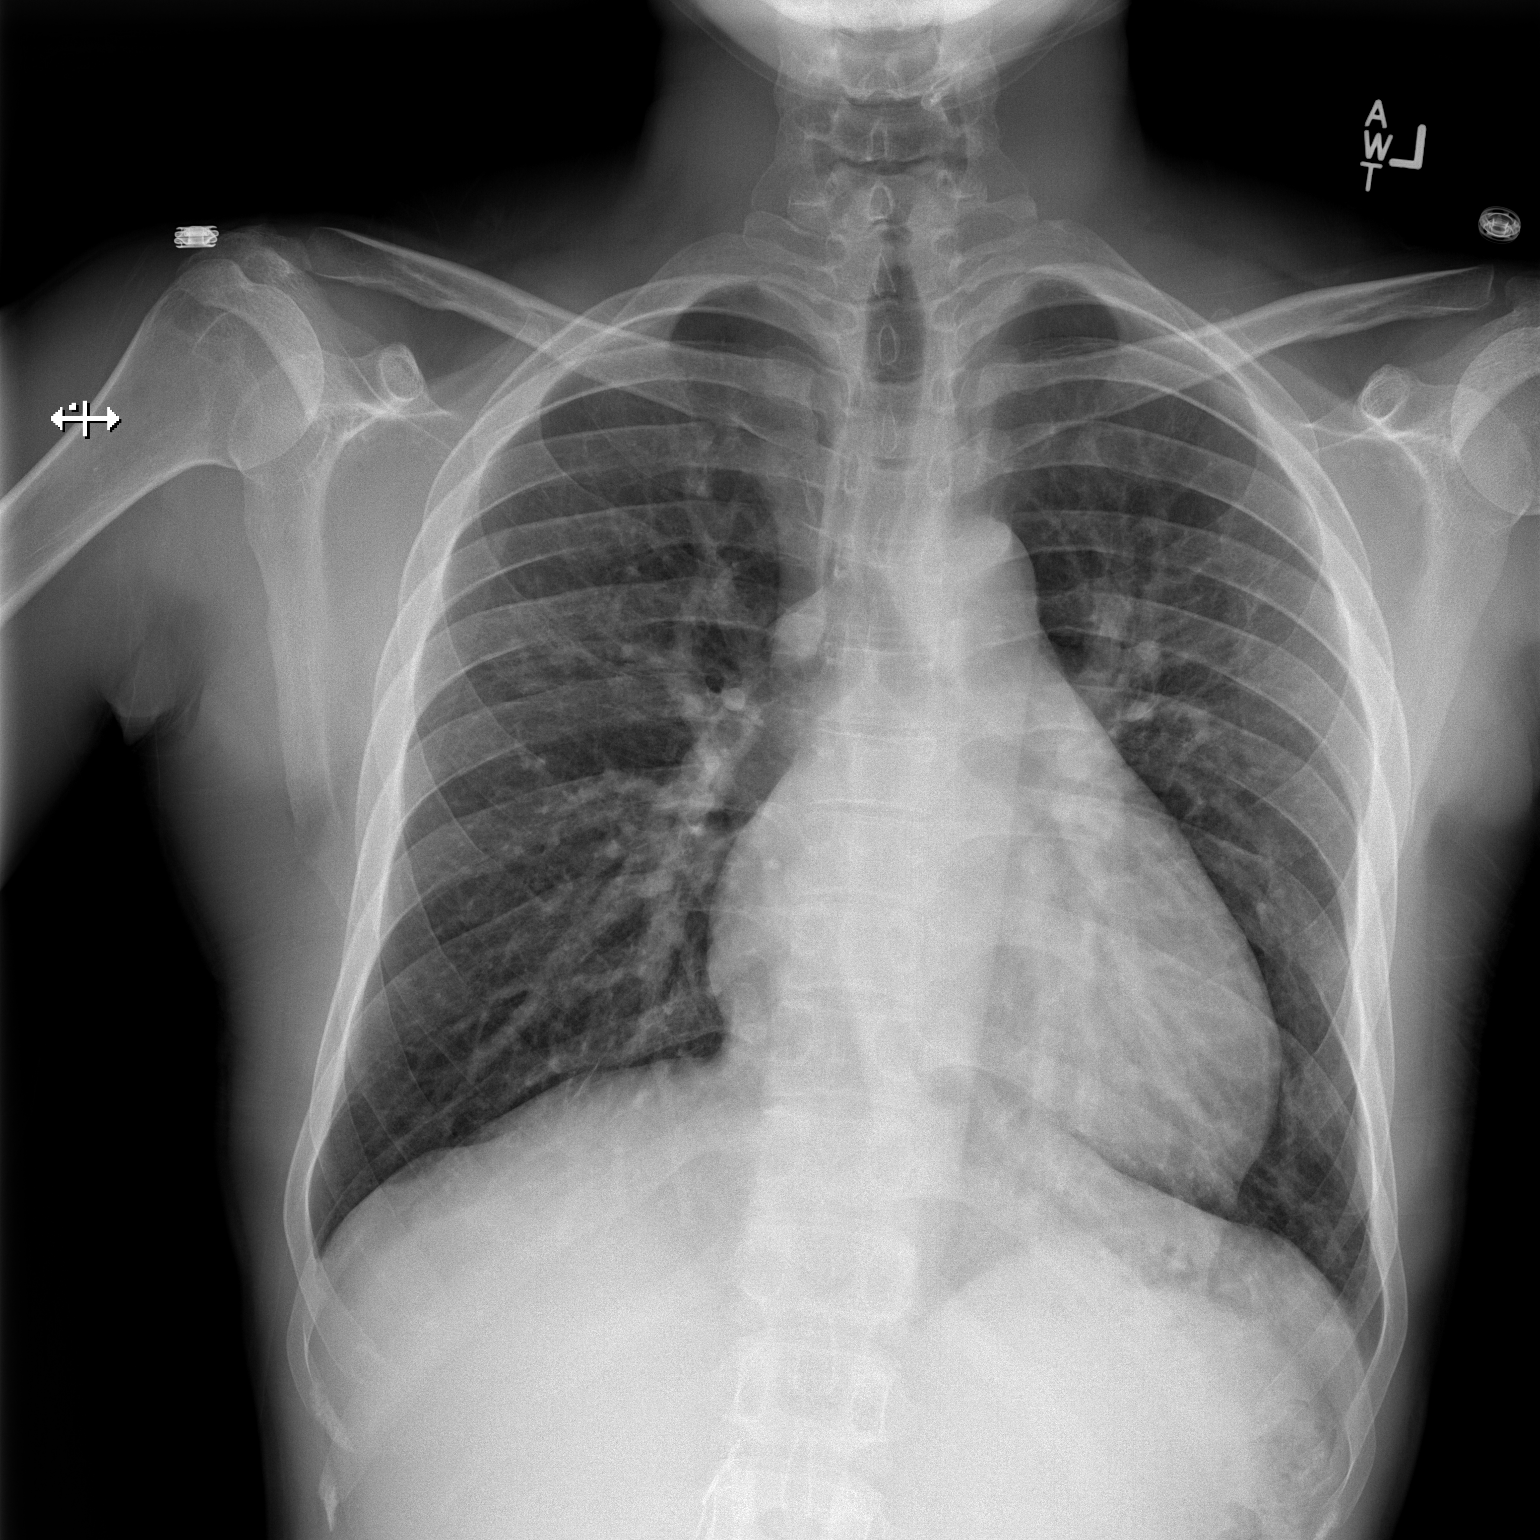

[w chest lat]
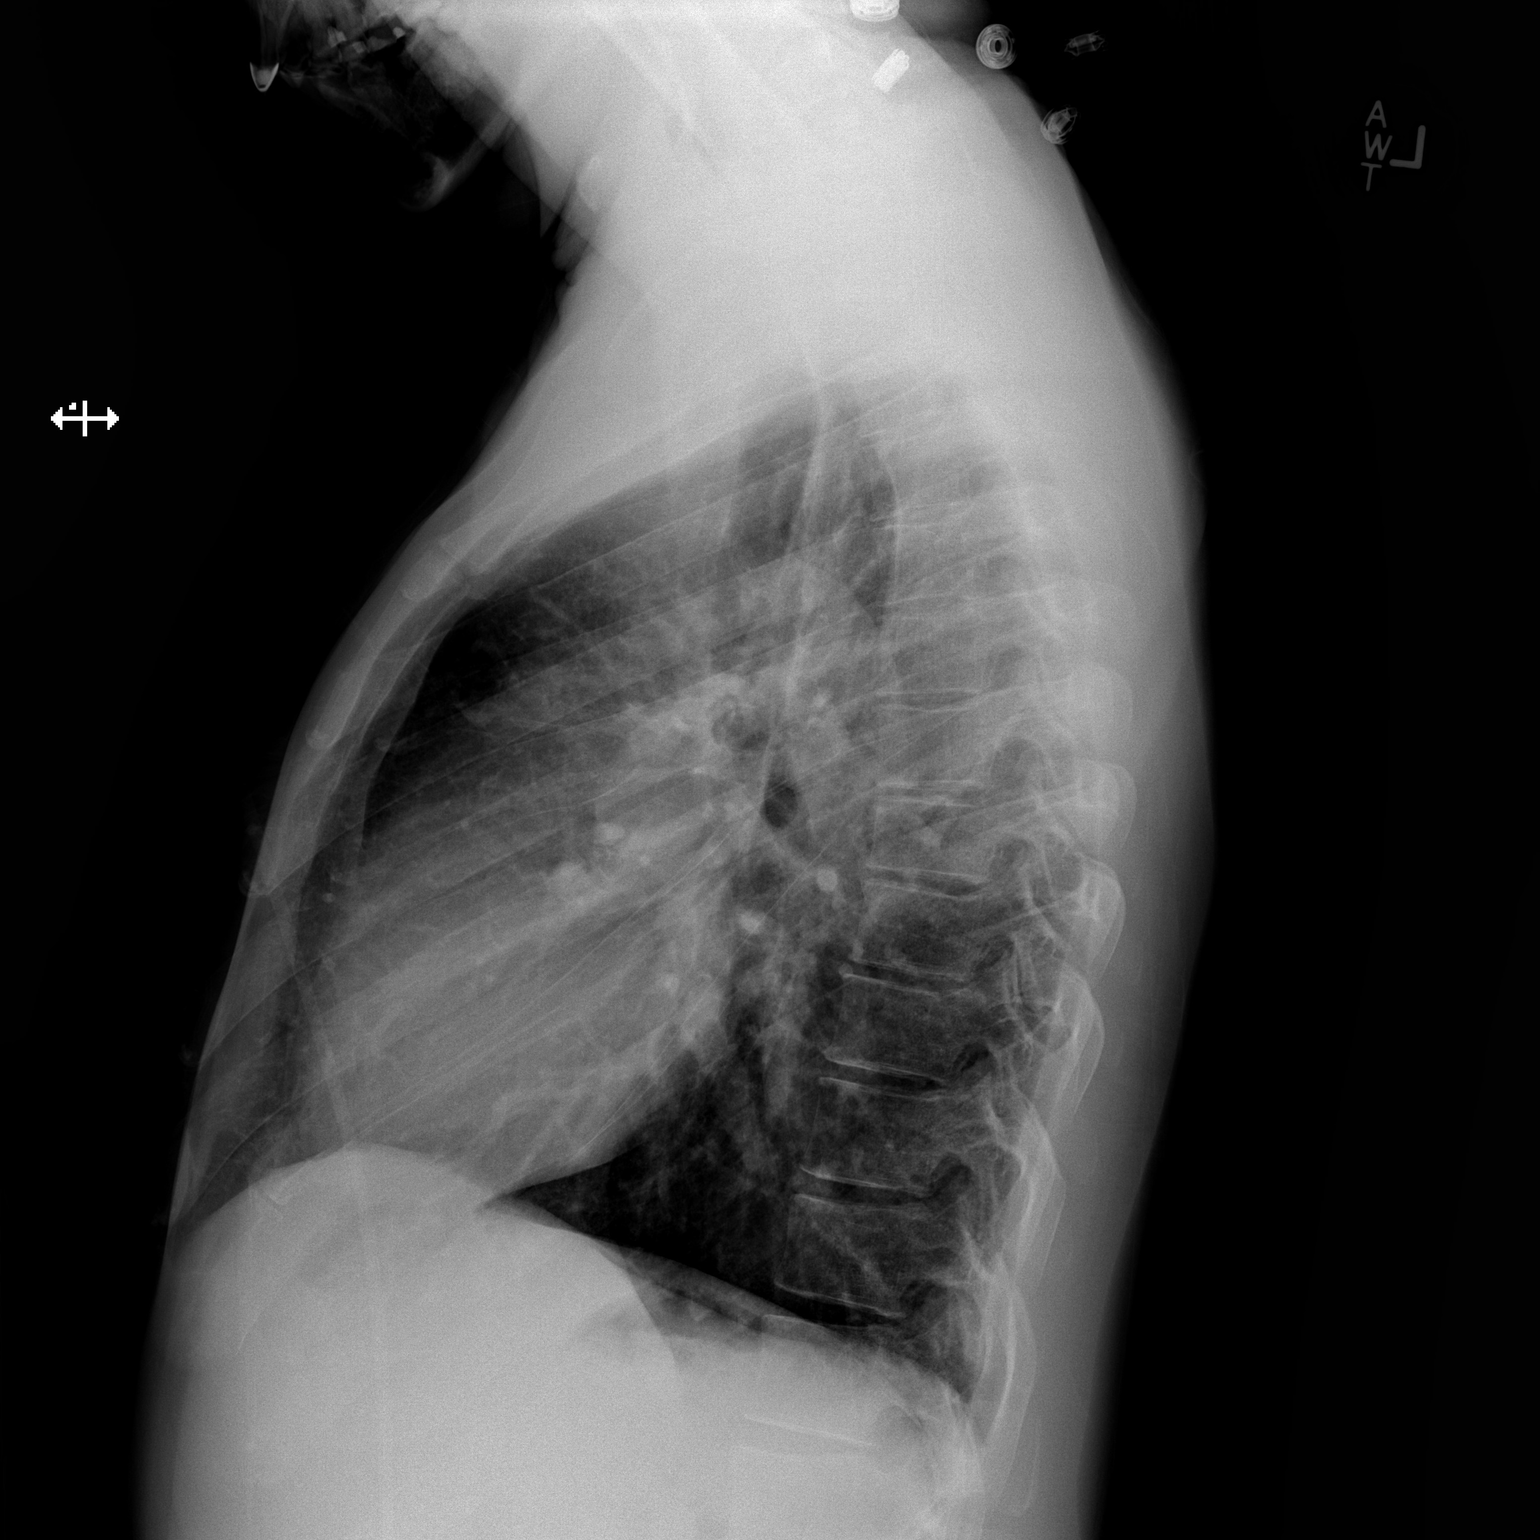

[2 of 2 positions shown; findings below may reference images not displayed]

FINDINGS: Enlargement of cardiac silhouette with pulmonary vascular
congestion.

Mediastinal contours normal.

Lungs clear.

No pleural effusion or pneumothorax.

Osseous structures unremarkable.
IMPRESSION: Enlargement of cardiac silhouette with pulmonary vascular congestion
consistent with history of sickle cell disease.

No acute infiltrate.

## 2014-11-21 IMAGING — CT CT ANGIO CHEST
1 of 2 series · 19 of 32 positions shown · IV contrast (omnipaque)
Comparison: Chest radiograph performed earlier today at [DATE] p.m.

CLINICAL DATA: Shortness of breath; sickle cell crisis. Evaluate
for pulmonary embolus.

EXAM:
CT ANGIOGRAPHY CHEST WITH CONTRAST
TECHNIQUE: Multidetector CT imaging of the chest was performed using the
standard protocol during bolus administration of intravenous
contrast. Multiplanar CT image reconstructions including MIPs were
obtained to evaluate the vascular anatomy.
CONTRAST:  100mL OMNIPAQUE IOHEXOL 350 MG/ML SOLN

[Series 11: thins for pacs · axial · 0.74mm/px · z∈[+1448,+1634]mm · 19 of 208 slices shown]
[im 11/208  lung]
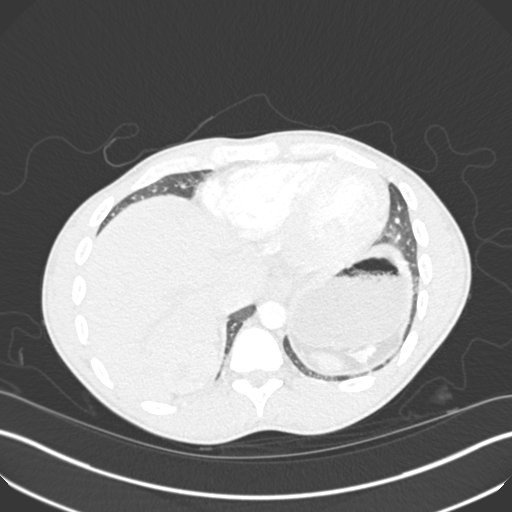
[im 21/208  mediastinal]
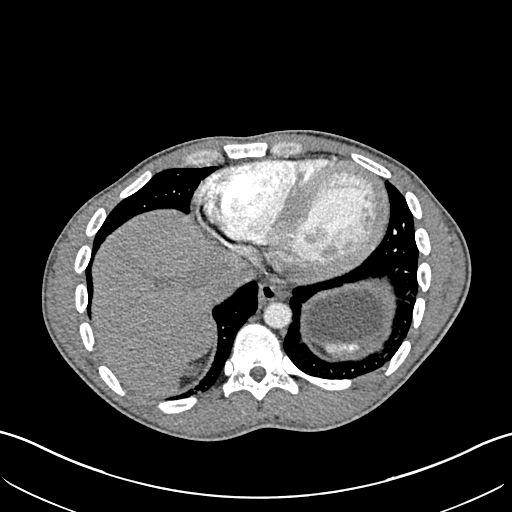
[im 32/208  lung]
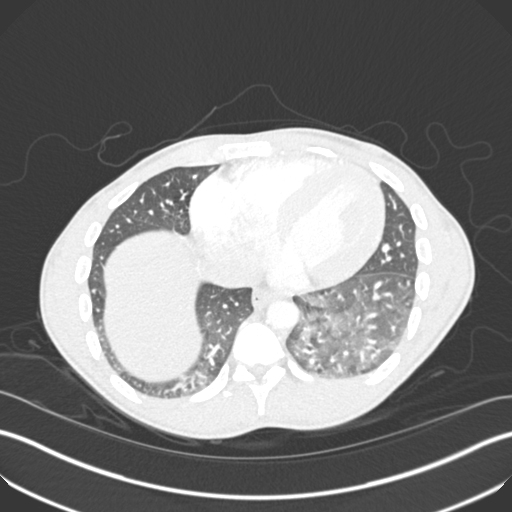
[im 52/208  mediastinal]
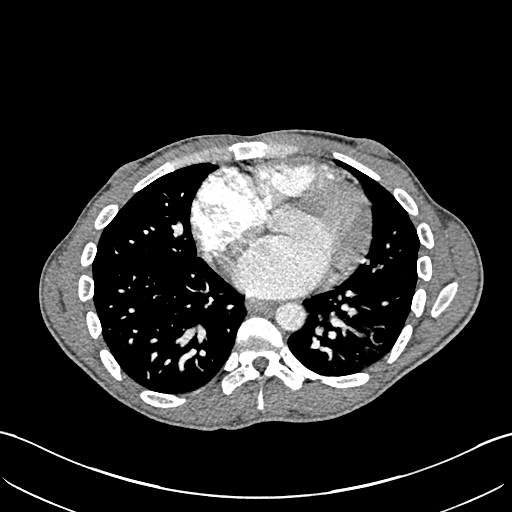
[im 63/208  lung]
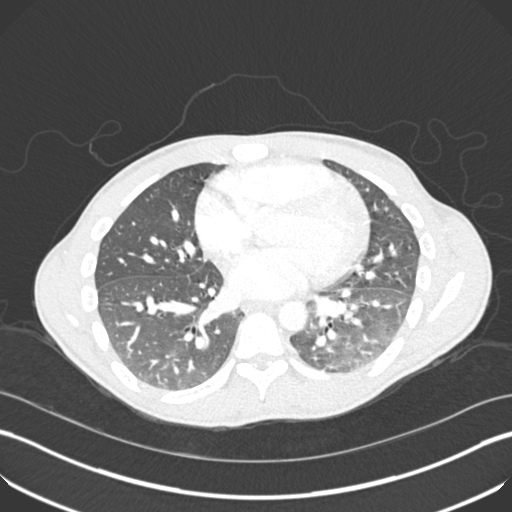
[im 70/208  mediastinal]
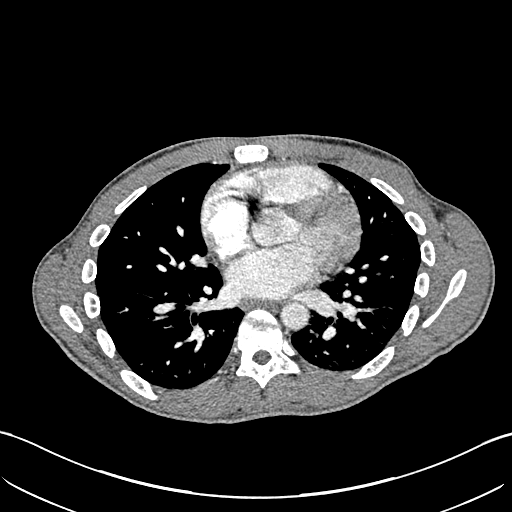
[im 73/208  lung]
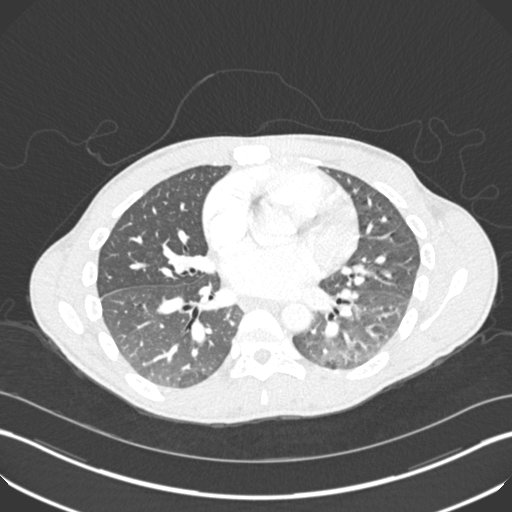
[im 83/208  mediastinal]
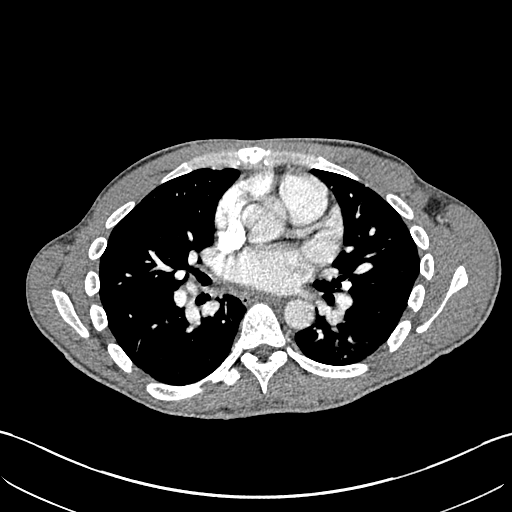
[im 94/208  lung]
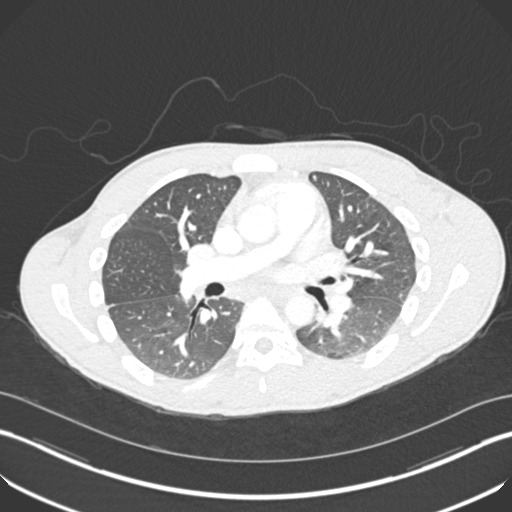
[im 104/208  mediastinal]
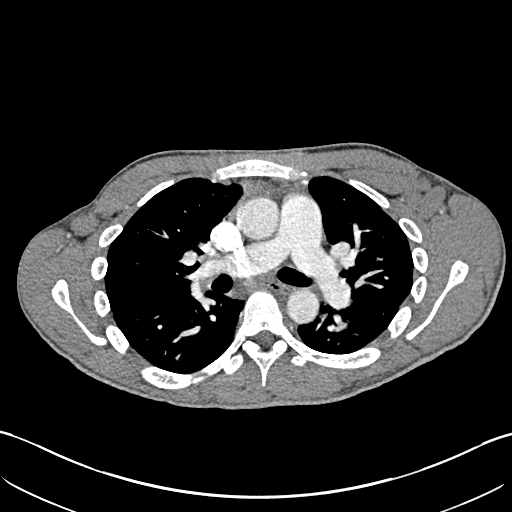
[im 114/208  lung]
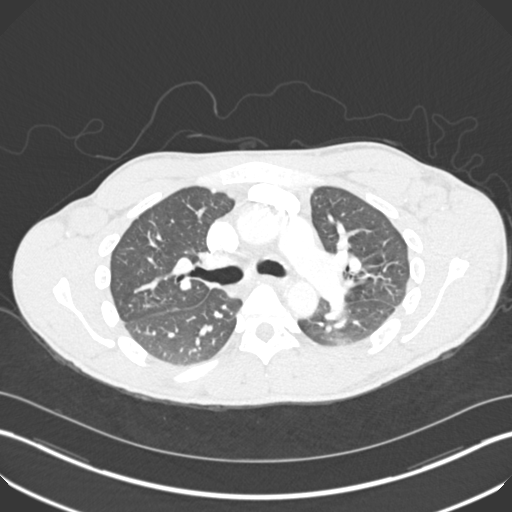
[im 125/208  mediastinal]
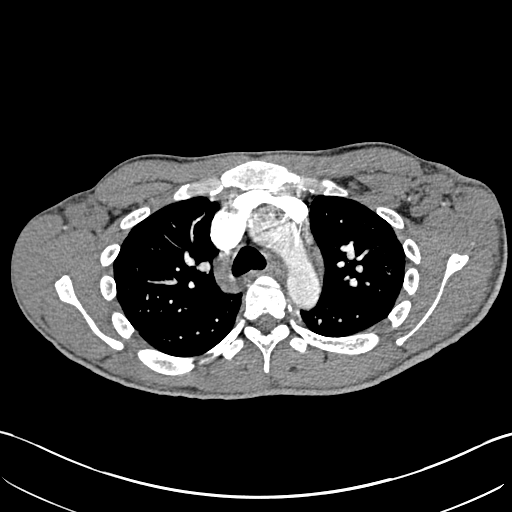
[im 135/208  lung]
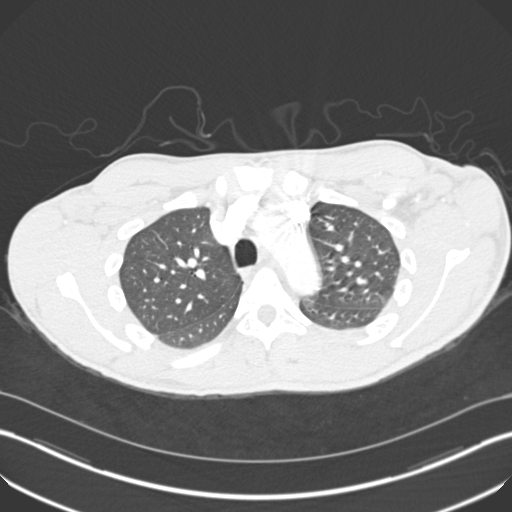
[im 139/208  mediastinal]
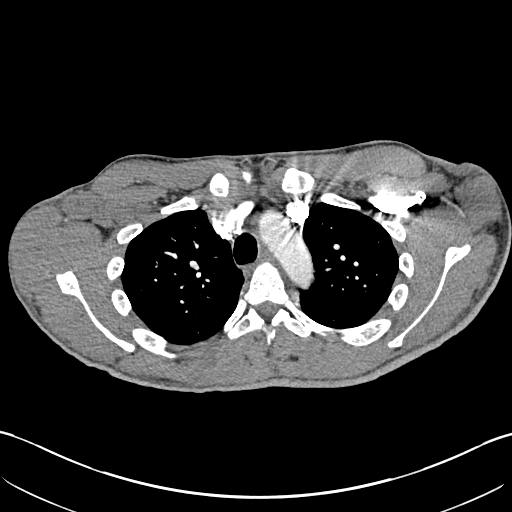
[im 145/208  lung]
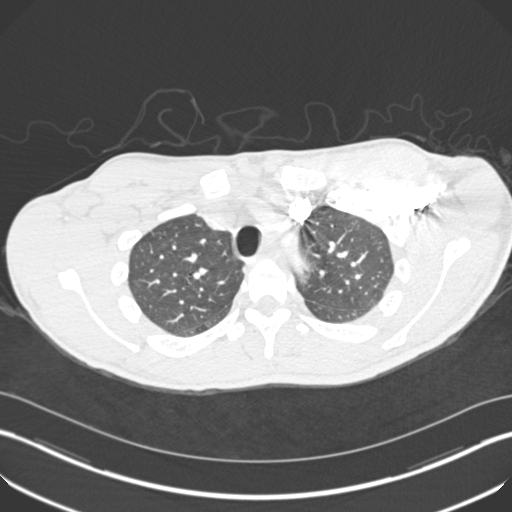
[im 156/208  mediastinal]
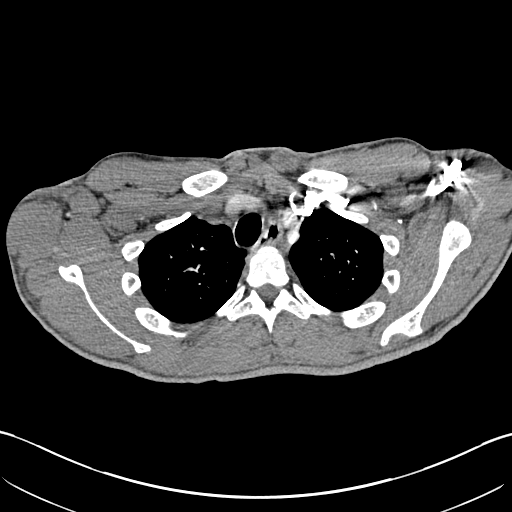
[im 176/208  lung]
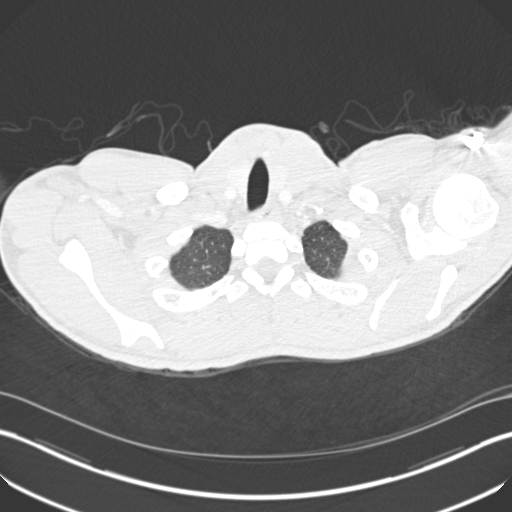
[im 187/208  mediastinal]
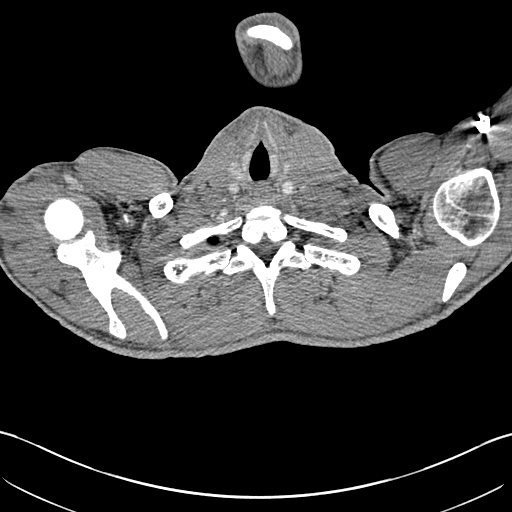
[im 197/208  lung]
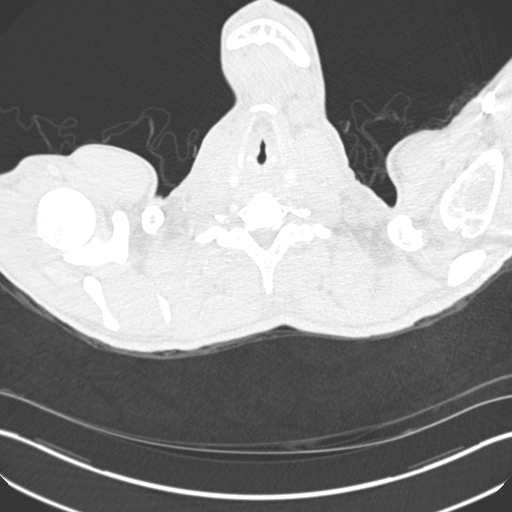

[19 of 32 positions shown; findings below may reference images not displayed]

FINDINGS: There is no evidence of pulmonary embolus.

Minimal left basilar atelectasis is noted. There is mild diffuse
prominence of the visualized pulmonary vasculature, compatible with
vascular congestion. There is no evidence of significant focal
consolidation, pleural effusion or pneumothorax. No masses are
identified; no abnormal focal contrast enhancement is seen.

The mediastinum is unremarkable in appearance. No mediastinal
lymphadenopathy is seen. No pericardial effusion is identified. The
great vessels are grossly unremarkable in appearance.

Enlarged bilateral axillary nodes are seen, measuring up to 3.2 cm
in short axis; these are of uncertain significance. The thyroid
gland is unremarkable in appearance.

The visualized portions of the liver are unremarkable. The spleen is
markedly atrophic.

No acute osseous abnormalities are seen.

Review of the MIP images confirms the above findings.
IMPRESSION: 1. No evidence of pulmonary embolus.
2. Minimal left basilar atelectasis noted. Underlying vascular
congestion noted. Lungs otherwise clear.
3. Significantly enlarged bilateral axillary nodes, measuring up to
3.2 cm in short axis. These are of uncertain significance. They are
close to the skin surface and would be amenable to biopsy, as deemed
clinically appropriate.

## 2015-02-15 IMAGING — CR DG CHEST 2V
2 series · 2 of 2 positions shown · non-contrast
Comparison: Prior radiograph from 11/13/2013

CLINICAL DATA: Sickle cell pain crisis

EXAM:
CHEST  2 VIEW

[w chest lat]
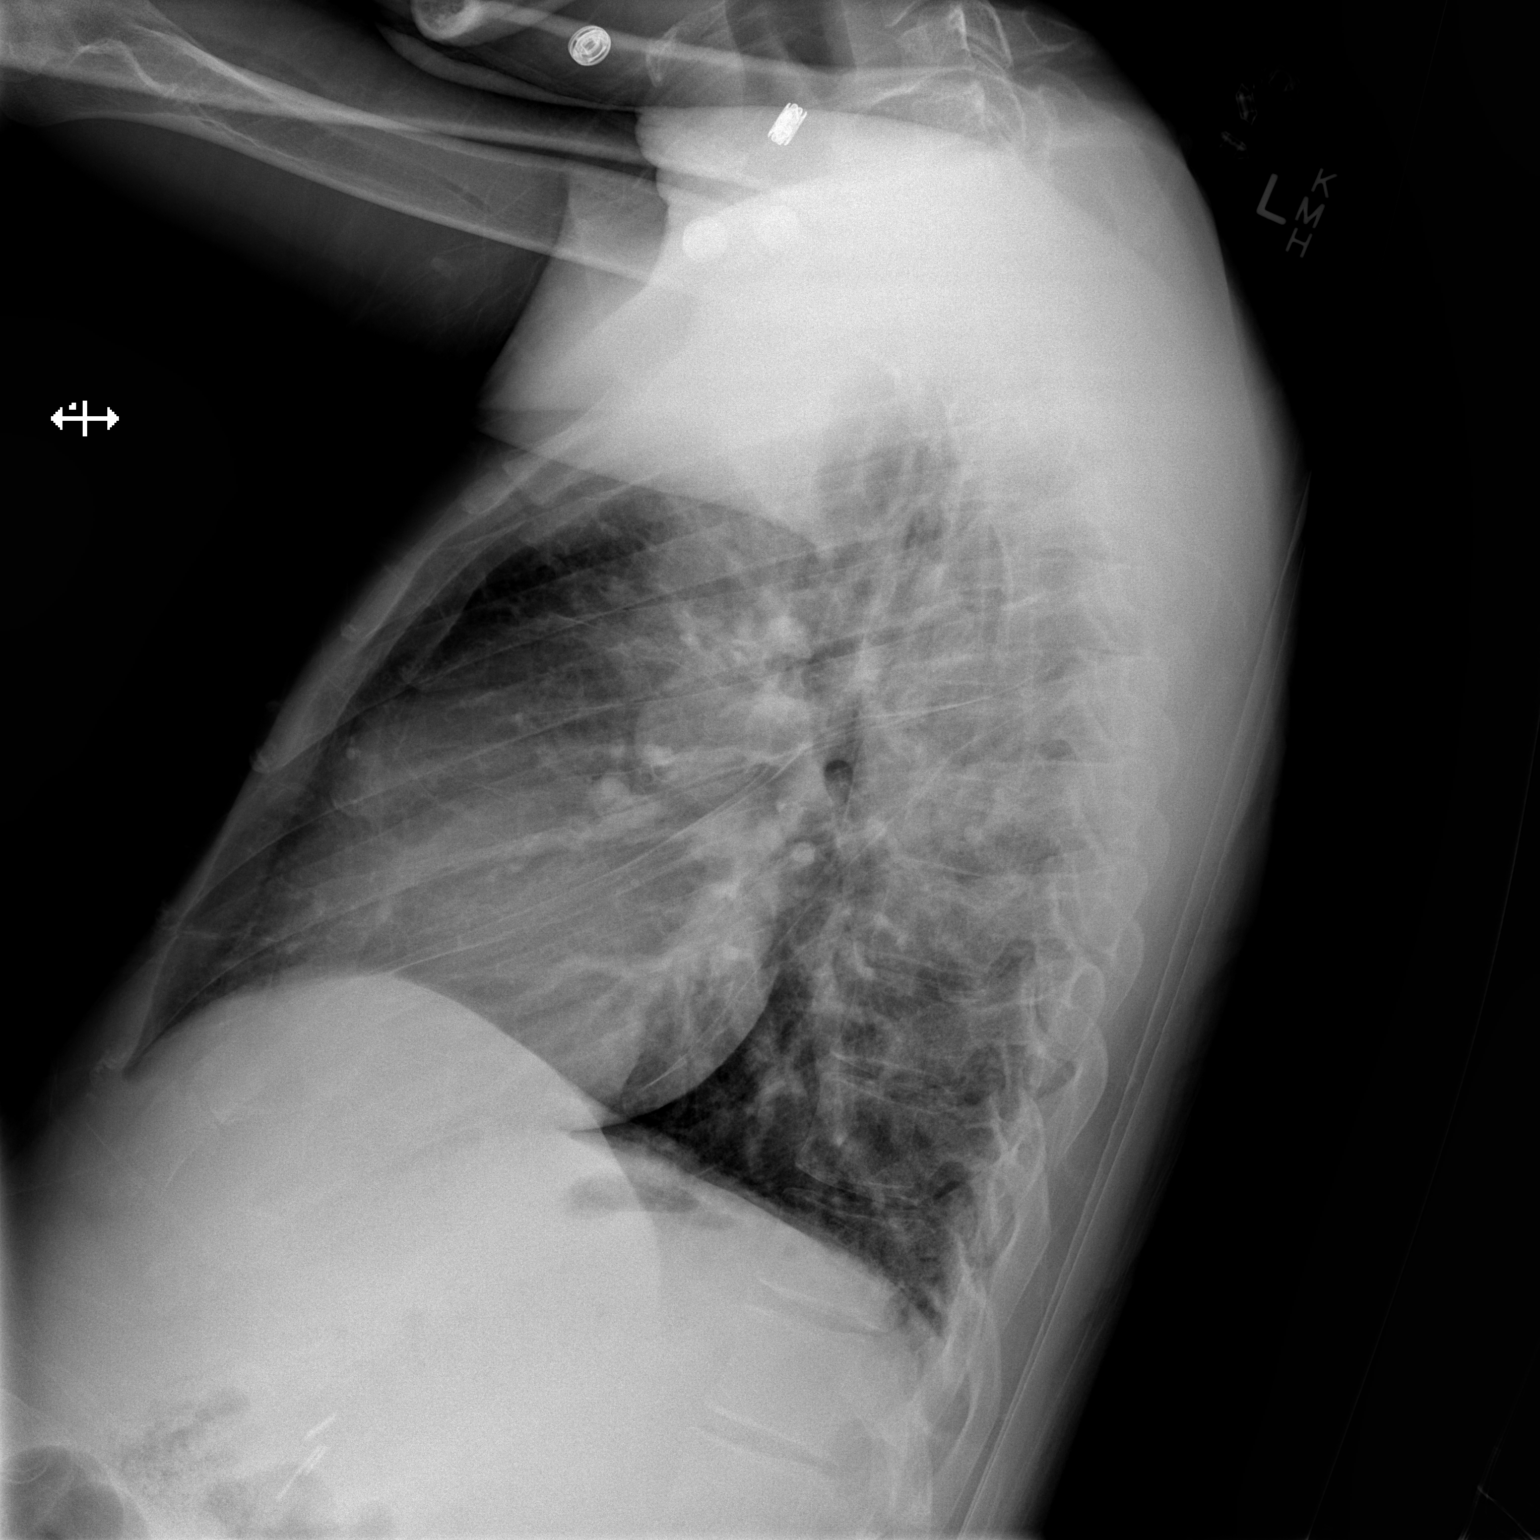

[x chest ap]
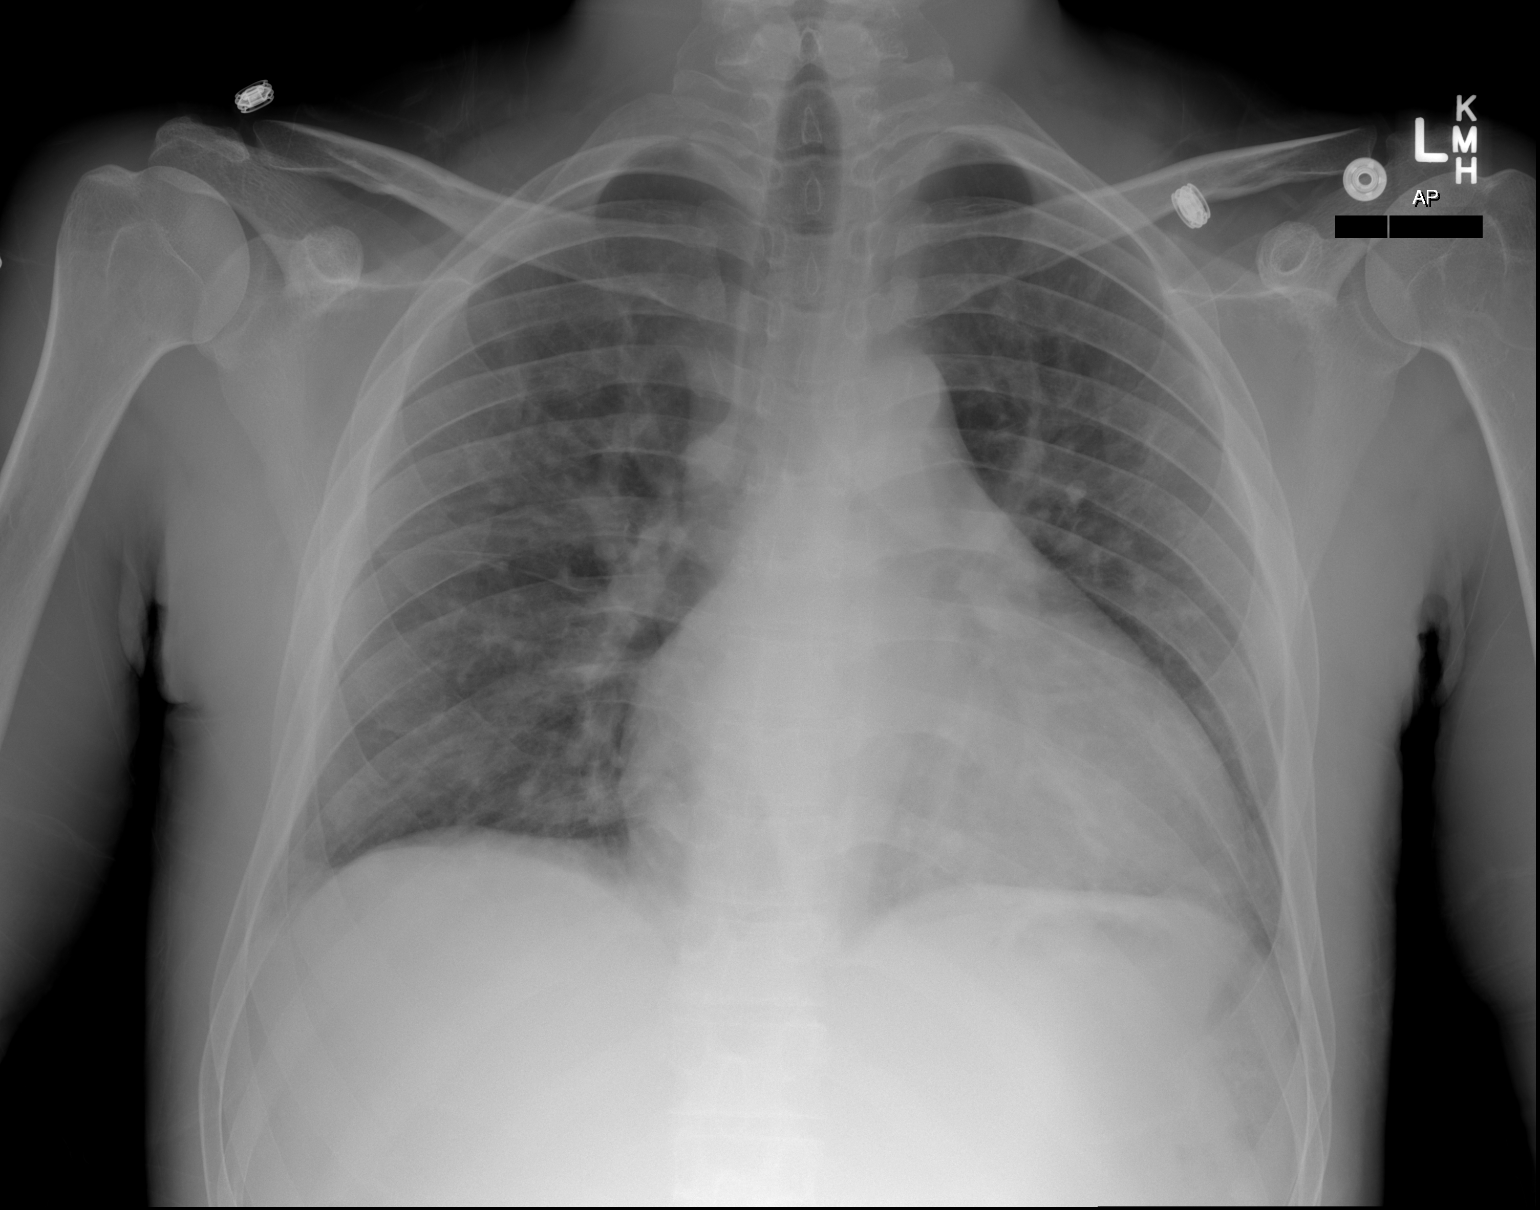

[2 of 2 positions shown; findings below may reference images not displayed]

FINDINGS: Cardiomegaly is stable as compared to the prior exam.

The lungs are normally inflated. There is mild diffuse pulmonary
vascular congestion without overt pulmonary edema. No focal
infiltrate identified. No pleural effusion. No pneumothorax.

No acute osseous abnormality identified.
IMPRESSION: Stable cardiomegaly with mild diffuse pulmonary vascular congestion
without overt pulmonary edema. No focal infiltrates identified.
